# Patient Record
Sex: Male | Born: 1940 | Race: White | Hispanic: No | Marital: Single | Smoking: Current every day smoker
Health system: Southern US, Community
[De-identification: ages and names within clinical notes are randomized; demographics above are authoritative.]

## PROBLEM LIST (undated history)

## (undated) DIAGNOSIS — I825Z9 Chronic embolism and thrombosis of unspecified deep veins of unspecified distal lower extremity: Secondary | ICD-10-CM

## (undated) DIAGNOSIS — F1010 Alcohol abuse, uncomplicated: Secondary | ICD-10-CM

## (undated) DIAGNOSIS — E876 Hypokalemia: Secondary | ICD-10-CM

## (undated) DIAGNOSIS — R310 Gross hematuria: Secondary | ICD-10-CM

## (undated) DIAGNOSIS — M109 Gout, unspecified: Secondary | ICD-10-CM

## (undated) DIAGNOSIS — I10 Essential (primary) hypertension: Secondary | ICD-10-CM

## (undated) DIAGNOSIS — N289 Disorder of kidney and ureter, unspecified: Secondary | ICD-10-CM

## (undated) DIAGNOSIS — D649 Anemia, unspecified: Secondary | ICD-10-CM

## (undated) DIAGNOSIS — N400 Enlarged prostate without lower urinary tract symptoms: Secondary | ICD-10-CM

## (undated) DIAGNOSIS — T148XXA Other injury of unspecified body region, initial encounter: Secondary | ICD-10-CM

## (undated) DIAGNOSIS — K8020 Calculus of gallbladder without cholecystitis without obstruction: Secondary | ICD-10-CM

## (undated) DIAGNOSIS — M1990 Unspecified osteoarthritis, unspecified site: Secondary | ICD-10-CM

## (undated) DIAGNOSIS — D62 Acute posthemorrhagic anemia: Secondary | ICD-10-CM

## (undated) DIAGNOSIS — R531 Weakness: Secondary | ICD-10-CM

## (undated) DIAGNOSIS — R609 Edema, unspecified: Secondary | ICD-10-CM

## (undated) DIAGNOSIS — R339 Retention of urine, unspecified: Secondary | ICD-10-CM

## (undated) HISTORY — PX: KNEE SURGERY: SHX244

## (undated) HISTORY — PX: PROSTATE BIOPSY: SHX241

---

## 1998-12-16 ENCOUNTER — Encounter: Payer: Self-pay | Admitting: Emergency Medicine

## 1998-12-16 ENCOUNTER — Emergency Department (HOSPITAL_COMMUNITY): Admission: EM | Admit: 1998-12-16 | Discharge: 1998-12-16 | Payer: Self-pay | Admitting: *Deleted

## 2000-04-08 ENCOUNTER — Inpatient Hospital Stay (HOSPITAL_COMMUNITY): Admission: EM | Admit: 2000-04-08 | Discharge: 2000-04-10 | Payer: Self-pay | Admitting: Emergency Medicine

## 2000-04-09 ENCOUNTER — Encounter: Payer: Self-pay | Admitting: *Deleted

## 2000-12-09 ENCOUNTER — Emergency Department (HOSPITAL_COMMUNITY): Admission: EM | Admit: 2000-12-09 | Discharge: 2000-12-09 | Payer: Self-pay | Admitting: Emergency Medicine

## 2001-02-14 ENCOUNTER — Emergency Department (HOSPITAL_COMMUNITY): Admission: EM | Admit: 2001-02-14 | Discharge: 2001-02-14 | Payer: Self-pay | Admitting: Emergency Medicine

## 2001-04-09 ENCOUNTER — Emergency Department (HOSPITAL_COMMUNITY): Admission: EM | Admit: 2001-04-09 | Discharge: 2001-04-09 | Payer: Self-pay | Admitting: Emergency Medicine

## 2003-11-06 ENCOUNTER — Emergency Department (HOSPITAL_COMMUNITY): Admission: AC | Admit: 2003-11-06 | Discharge: 2003-11-06 | Payer: Self-pay

## 2003-11-19 ENCOUNTER — Emergency Department (HOSPITAL_COMMUNITY): Admission: EM | Admit: 2003-11-19 | Discharge: 2003-11-20 | Payer: Self-pay

## 2003-12-28 ENCOUNTER — Emergency Department (HOSPITAL_COMMUNITY): Admission: EM | Admit: 2003-12-28 | Discharge: 2003-12-29 | Payer: Self-pay | Admitting: Emergency Medicine

## 2004-12-18 ENCOUNTER — Emergency Department (HOSPITAL_COMMUNITY): Admission: EM | Admit: 2004-12-18 | Discharge: 2004-12-18 | Payer: Self-pay | Admitting: Emergency Medicine

## 2005-05-02 ENCOUNTER — Emergency Department (HOSPITAL_COMMUNITY): Admission: EM | Admit: 2005-05-02 | Discharge: 2005-05-02 | Payer: Self-pay | Admitting: Emergency Medicine

## 2006-01-24 ENCOUNTER — Emergency Department (HOSPITAL_COMMUNITY): Admission: EM | Admit: 2006-01-24 | Discharge: 2006-01-24 | Payer: Self-pay | Admitting: Emergency Medicine

## 2007-11-03 ENCOUNTER — Ambulatory Visit (HOSPITAL_COMMUNITY): Admission: RE | Admit: 2007-11-03 | Discharge: 2007-11-03 | Payer: Self-pay | Admitting: Family Medicine

## 2011-09-11 ENCOUNTER — Encounter (HOSPITAL_COMMUNITY): Payer: Self-pay

## 2011-09-11 ENCOUNTER — Emergency Department (HOSPITAL_COMMUNITY): Payer: Medicare Other

## 2011-09-11 ENCOUNTER — Inpatient Hospital Stay (HOSPITAL_COMMUNITY)
Admission: EM | Admit: 2011-09-11 | Discharge: 2011-09-14 | DRG: 562 | Disposition: A | Discharge status: Skilled Nursing Facility | Payer: Medicare Other | Attending: Internal Medicine | Admitting: Internal Medicine

## 2011-09-11 ENCOUNTER — Other Ambulatory Visit: Payer: Self-pay

## 2011-09-11 DIAGNOSIS — E871 Hypo-osmolality and hyponatremia: Secondary | ICD-10-CM

## 2011-09-11 DIAGNOSIS — D72819 Decreased white blood cell count, unspecified: Secondary | ICD-10-CM | POA: Diagnosis present

## 2011-09-11 DIAGNOSIS — W19XXXA Unspecified fall, initial encounter: Secondary | ICD-10-CM | POA: Diagnosis present

## 2011-09-11 DIAGNOSIS — E872 Acidosis, unspecified: Secondary | ICD-10-CM | POA: Diagnosis present

## 2011-09-11 DIAGNOSIS — D61818 Other pancytopenia: Secondary | ICD-10-CM | POA: Diagnosis present

## 2011-09-11 DIAGNOSIS — L03314 Cellulitis of groin: Secondary | ICD-10-CM | POA: Diagnosis present

## 2011-09-11 DIAGNOSIS — R531 Weakness: Secondary | ICD-10-CM | POA: Diagnosis present

## 2011-09-11 DIAGNOSIS — D638 Anemia in other chronic diseases classified elsewhere: Secondary | ICD-10-CM | POA: Diagnosis present

## 2011-09-11 DIAGNOSIS — M129 Arthropathy, unspecified: Secondary | ICD-10-CM | POA: Diagnosis present

## 2011-09-11 DIAGNOSIS — E43 Unspecified severe protein-calorie malnutrition: Secondary | ICD-10-CM | POA: Diagnosis present

## 2011-09-11 DIAGNOSIS — L02219 Cutaneous abscess of trunk, unspecified: Secondary | ICD-10-CM | POA: Diagnosis present

## 2011-09-11 DIAGNOSIS — L03319 Cellulitis of trunk, unspecified: Secondary | ICD-10-CM | POA: Diagnosis present

## 2011-09-11 DIAGNOSIS — F1010 Alcohol abuse, uncomplicated: Secondary | ICD-10-CM | POA: Diagnosis present

## 2011-09-11 DIAGNOSIS — R6251 Failure to thrive (child): Secondary | ICD-10-CM

## 2011-09-11 DIAGNOSIS — R5381 Other malaise: Secondary | ICD-10-CM | POA: Diagnosis present

## 2011-09-11 DIAGNOSIS — R627 Adult failure to thrive: Secondary | ICD-10-CM | POA: Diagnosis present

## 2011-09-11 DIAGNOSIS — S42309A Unspecified fracture of shaft of humerus, unspecified arm, initial encounter for closed fracture: Secondary | ICD-10-CM

## 2011-09-11 DIAGNOSIS — S42209A Unspecified fracture of upper end of unspecified humerus, initial encounter for closed fracture: Principal | ICD-10-CM | POA: Diagnosis present

## 2011-09-11 DIAGNOSIS — Z888 Allergy status to other drugs, medicaments and biological substances status: Secondary | ICD-10-CM

## 2011-09-11 DIAGNOSIS — S42302A Unspecified fracture of shaft of humerus, left arm, initial encounter for closed fracture: Secondary | ICD-10-CM

## 2011-09-11 DIAGNOSIS — Z7982 Long term (current) use of aspirin: Secondary | ICD-10-CM

## 2011-09-11 DIAGNOSIS — D649 Anemia, unspecified: Secondary | ICD-10-CM | POA: Diagnosis present

## 2011-09-11 DIAGNOSIS — I10 Essential (primary) hypertension: Secondary | ICD-10-CM | POA: Diagnosis present

## 2011-09-11 DIAGNOSIS — M109 Gout, unspecified: Secondary | ICD-10-CM | POA: Diagnosis present

## 2011-09-11 DIAGNOSIS — E860 Dehydration: Secondary | ICD-10-CM | POA: Diagnosis present

## 2011-09-11 DIAGNOSIS — E876 Hypokalemia: Secondary | ICD-10-CM | POA: Diagnosis present

## 2011-09-11 DIAGNOSIS — B356 Tinea cruris: Secondary | ICD-10-CM | POA: Diagnosis present

## 2011-09-11 HISTORY — DX: Essential (primary) hypertension: I10

## 2011-09-11 HISTORY — DX: Unspecified osteoarthritis, unspecified site: M19.90

## 2011-09-11 LAB — DIFFERENTIAL
Basophils Relative: 1 % (ref 0–1)
Eosinophils Absolute: 0 10*3/uL (ref 0.0–0.7)
Lymphocytes Relative: 16 % (ref 12–46)
Lymphs Abs: 0.9 10*3/uL (ref 0.7–4.0)
Monocytes Relative: 5 % (ref 3–12)
Neutro Abs: 4.5 10*3/uL (ref 1.7–7.7)

## 2011-09-11 LAB — URINE MICROSCOPIC-ADD ON

## 2011-09-11 LAB — URINALYSIS, ROUTINE W REFLEX MICROSCOPIC
Ketones, ur: 15 mg/dL — AB
Protein, ur: 30 mg/dL — AB
Urobilinogen, UA: 4 mg/dL — ABNORMAL HIGH (ref 0.0–1.0)
pH: 5.5 (ref 5.0–8.0)

## 2011-09-11 LAB — LIPASE, BLOOD: Lipase: 22 U/L (ref 11–59)

## 2011-09-11 LAB — CBC
HCT: 30.5 % — ABNORMAL LOW (ref 39.0–52.0)
MCH: 30.7 pg (ref 26.0–34.0)
MCHC: 32.5 g/dL (ref 30.0–36.0)
MCV: 94.7 fL (ref 78.0–100.0)
RBC: 3.22 MIL/uL — ABNORMAL LOW (ref 4.22–5.81)
RDW: 17.4 % — ABNORMAL HIGH (ref 11.5–15.5)
WBC: 5.8 10*3/uL (ref 4.0–10.5)

## 2011-09-11 LAB — CK TOTAL AND CKMB (NOT AT ARMC): Total CK: 33 U/L (ref 7–232)

## 2011-09-11 LAB — RAPID URINE DRUG SCREEN, HOSP PERFORMED
Amphetamines: NOT DETECTED
Benzodiazepines: NOT DETECTED
Opiates: NOT DETECTED
Tetrahydrocannabinol: NOT DETECTED

## 2011-09-11 LAB — MAGNESIUM: Magnesium: 1.7 mg/dL (ref 1.5–2.5)

## 2011-09-11 LAB — BASIC METABOLIC PANEL
BUN: 17 mg/dL (ref 6–23)
CO2: 15 mEq/L — ABNORMAL LOW (ref 19–32)
Chloride: 95 mEq/L — ABNORMAL LOW (ref 96–112)
GFR calc Af Amer: >90 mL/min (ref >90)
Potassium: 3.3 mEq/L — ABNORMAL LOW (ref 3.5–5.1)

## 2011-09-11 LAB — TROPONIN I: Troponin I: <0.3 ng/mL (ref <0.30)

## 2011-09-11 LAB — OCCULT BLOOD, POC DEVICE: Fecal Occult Bld: NEGATIVE

## 2011-09-11 MED ORDER — ACETAMINOPHEN 650 MG RE SUPP: 650.0000 mg | Freq: Four times a day (QID) | RECTAL | Status: DC | PRN

## 2011-09-11 MED ORDER — SULFAMETHOXAZOLE-TMP DS 800-160 MG PO TABS
1.0000 | ORAL_TABLET | Freq: Two times a day (BID) | ORAL | Status: DC
  Administered 2011-09-11 – 2011-09-14 (×6): 1 via ORAL
  Filled 2011-09-11 (×7): qty 1

## 2011-09-11 MED ORDER — FOLIC ACID 1 MG PO TABS
1.0000 mg | ORAL_TABLET | Freq: Every day | ORAL | Status: DC
  Administered 2011-09-11 – 2011-09-14 (×4): 1 mg via ORAL
  Filled 2011-09-11 (×4): qty 1

## 2011-09-11 MED ORDER — THIAMINE HCL 100 MG/ML IJ SOLN: 100.0000 mg | Freq: Every day | INTRAMUSCULAR | Status: DC

## 2011-09-11 MED ORDER — ONDANSETRON HCL 4 MG PO TABS: 4.0000 mg | ORAL_TABLET | Freq: Four times a day (QID) | ORAL | Status: DC | PRN

## 2011-09-11 MED ORDER — NYSTATIN 100000 UNIT/GM EX CREA
TOPICAL_CREAM | Freq: Two times a day (BID) | CUTANEOUS | Status: DC
  Administered 2011-09-11 – 2011-09-13 (×5): via TOPICAL
  Filled 2011-09-11: qty 15

## 2011-09-11 MED ORDER — LORAZEPAM 1 MG PO TABS: 0.0000 mg | ORAL_TABLET | Freq: Two times a day (BID) | ORAL | Status: DC

## 2011-09-11 MED ORDER — LORAZEPAM 2 MG/ML IJ SOLN: 1.0000 mg | Freq: Four times a day (QID) | INTRAMUSCULAR | Status: DC | PRN

## 2011-09-11 MED ORDER — ADULT MULTIVITAMIN W/MINERALS CH
1.0000 | ORAL_TABLET | Freq: Every day | ORAL | Status: DC
  Administered 2011-09-11 – 2011-09-14 (×4): 1 via ORAL
  Filled 2011-09-11 (×4): qty 1

## 2011-09-11 MED ORDER — POTASSIUM CHLORIDE 20 MEQ PO PACK
20.0000 meq | PACK | Freq: Once | ORAL | Status: AC
  Administered 2011-09-11: 20 meq via ORAL
  Filled 2011-09-11: qty 1

## 2011-09-11 MED ORDER — MORPHINE SULFATE 2 MG/ML IJ SOLN
2.0000 mg | Freq: Once | INTRAMUSCULAR | Status: AC
  Administered 2011-09-11: 2 mg via INTRAVENOUS
  Filled 2011-09-11: qty 1

## 2011-09-11 MED ORDER — SODIUM CHLORIDE 0.9 % IV BOLUS (SEPSIS)
500.0000 mL | Freq: Once | INTRAVENOUS | Status: AC
  Administered 2011-09-11: 500 mL via INTRAVENOUS

## 2011-09-11 MED ORDER — THIAMINE HCL 100 MG/ML IJ SOLN
Freq: Once | INTRAVENOUS | Status: AC
  Administered 2011-09-11: 18:00:00 via INTRAVENOUS
  Filled 2011-09-11: qty 1000

## 2011-09-11 MED ORDER — LORAZEPAM 1 MG PO TABS
1.0000 mg | ORAL_TABLET | Freq: Four times a day (QID) | ORAL | Status: DC | PRN
  Administered 2011-09-12: 1 mg via ORAL

## 2011-09-11 MED ORDER — ENOXAPARIN SODIUM 40 MG/0.4ML ~~LOC~~ SOLN
40.0000 mg | Freq: Every day | SUBCUTANEOUS | Status: DC
  Administered 2011-09-11 – 2011-09-14 (×3): 40 mg via SUBCUTANEOUS
  Filled 2011-09-11 (×3): qty 0.4

## 2011-09-11 MED ORDER — ONDANSETRON HCL 4 MG/2ML IJ SOLN: 4.0000 mg | Freq: Four times a day (QID) | INTRAMUSCULAR | Status: DC | PRN

## 2011-09-11 MED ORDER — ACETAMINOPHEN 325 MG PO TABS: 650.0000 mg | ORAL_TABLET | Freq: Four times a day (QID) | ORAL | Status: DC | PRN

## 2011-09-11 MED ORDER — HYDROCODONE-ACETAMINOPHEN 5-325 MG PO TABS
1.0000 | ORAL_TABLET | ORAL | Status: DC | PRN
  Administered 2011-09-12: 2 via ORAL
  Administered 2011-09-12: 1 via ORAL
  Administered 2011-09-13 – 2011-09-14 (×3): 2 via ORAL
  Filled 2011-09-11 (×4): qty 2
  Filled 2011-09-11: qty 1

## 2011-09-11 MED ORDER — LORAZEPAM 1 MG PO TABS
0.0000 mg | ORAL_TABLET | Freq: Four times a day (QID) | ORAL | Status: AC
  Administered 2011-09-11: 1 mg via ORAL
  Filled 2011-09-11 (×2): qty 1

## 2011-09-11 MED ORDER — VITAMIN B-1 100 MG PO TABS
100.0000 mg | ORAL_TABLET | Freq: Every day | ORAL | Status: DC
  Administered 2011-09-11 – 2011-09-14 (×4): 100 mg via ORAL
  Filled 2011-09-11 (×4): qty 1

## 2011-09-11 MED ORDER — LORAZEPAM 2 MG/ML IJ SOLN
1.0000 mg | Freq: Once | INTRAMUSCULAR | Status: AC
Start: 2011-09-11 — End: 2011-09-11
  Administered 2011-09-11: 1 mg via INTRAVENOUS
  Filled 2011-09-11: qty 1

## 2011-09-11 NOTE — H&P (Signed)
PCP:   Dwana Melena, MD, MD   Chief Complaint:  Pain in left arm and generalized weakness  HPI: This is a 71 year old gentleman who presents to the emergency room today with complaints of generalized weakness and left arm pain. Patient is currently living in a motel for the past 3 years. He also has a history of alcohol abuse. He reports that approximately 4-6 weeks ago he had a fall. He does not remember the circumstances of his fall. Since then he has pain in his left arm. He did not seek any medical attention for it. He also reports that he feels generally weak. His oral intake is very poor for the past few weeks. He continues to drink alcohol. He denies any dizziness, chest pain, fever, cough, palpitations, dysuria, diarrhea, vomiting. He also complains of pain in his feet bilaterally but this has been occurring prior to his fall. He also reports significant pain and itching in his groin area.  He was evaluated in the emergency room he was found to be tachycardic with a heart rate in the 120s and dehydrated. X-rays of his left humerus revealed a fracture. Patient received some IV fluids with improvement in his heart rate. He was noted to have some significant metabolic acidosis with bicarbonate of 15. He is referred for admission.  Allergies:   Allergies  Allergen Reactions  . Lisinopril Other (See Comments)    Dizziness      Past Medical History  Diagnosis Date  . Arthritis   . Gout   . Hypertension     History reviewed. No pertinent past surgical history.  Prior to Admission medications   Not on File    Social History:  reports that he has been smoking.  He does not have any smokeless tobacco history on file. He reports that he drinks alcohol. He reports that he does not use illicit drugs.  No family history on file.  Review of Systems: Positives in bold Constitutional: Denies fever, chills, diaphoresis, appetite change and fatigue.  HEENT: Denies photophobia, eye pain,  redness, hearing loss, ear pain, congestion, sore throat, rhinorrhea, sneezing, mouth sores, trouble swallowing, neck pain, neck stiffness and tinnitus.   Respiratory: Denies SOB, DOE, cough, chest tightness,  and wheezing.   Cardiovascular: Denies chest pain, palpitations and leg swelling.  Gastrointestinal: Denies nausea, vomiting, abdominal pain, diarrhea, constipation, blood in stool and abdominal distention.  Genitourinary: Denies dysuria, urgency, frequency, hematuria, flank pain and difficulty urinating.  Musculoskeletal: Denies myalgias, back pain, joint swelling, arthralgias and gait problem.  Skin: Denies pallor, rash and wound.  Neurological: Denies dizziness, seizures, syncope, weakness, light-headedness, numbness and headaches.  Hematological: Denies adenopathy. Easy bruising, personal or family bleeding history  Psychiatric/Behavioral: Denies suicidal ideation, mood changes, confusion, nervousness, sleep disturbance and agitation   Physical Exam: Blood pressure 142/66, pulse 97, temperature 99.2 F (37.3 C), temperature source Rectal, height 5\' 11"  (1.803 m), weight 83.915 kg (185 lb), SpO2 100.00%. Gen. patient is lying in bed, in no acute distress, he is emaciated, very unkempt, disheveled HEENT: Normocephalic, atraumatic, pupils are equal round reactive to light, mucous membranes are tried Chest: Clear to auscultation bilaterally Cardiac: S1, S2, regular rate and rhythm Abdomen: Soft, nontender, bowel sounds are active Extremities: No cyanosis, clubbing, edema, Neurologic: Grossly intact, nonfocal, he does have some mild tremors Skin patient's skin in the groin is very erythematous, there is a foul-smelling drainage coming from the left groin, bullous lesions are in the left groin, no signs of bleeding.  Labs  on Admission:  Results for orders placed during the hospital encounter of 09/11/11 (from the past 48 hour(s))  CBC     Status: Abnormal   Collection Time   09/11/11  12:54 PM      Component Value Range Comment   WBC 5.8  4.0 - 10.5 (K/uL)    RBC 3.22 (*) 4.22 - 5.81 (MIL/uL)    Hemoglobin 9.9 (*) 13.0 - 17.0 (g/dL)    HCT 19.1 (*) 47.8 - 52.0 (%)    MCV 94.7  78.0 - 100.0 (fL)    MCH 30.7  26.0 - 34.0 (pg)    MCHC 32.5  30.0 - 36.0 (g/dL)    RDW 29.5 (*) 62.1 - 15.5 (%)    Platelets 294  150 - 400 (K/uL)   DIFFERENTIAL     Status: Abnormal   Collection Time   09/11/11 12:54 PM      Component Value Range Comment   Neutrophils Relative 78 (*) 43 - 77 (%)    Neutro Abs 4.5  1.7 - 7.7 (K/uL)    Lymphocytes Relative 16  12 - 46 (%)    Lymphs Abs 0.9  0.7 - 4.0 (K/uL)    Monocytes Relative 5  3 - 12 (%)    Monocytes Absolute 0.3  0.1 - 1.0 (K/uL)    Eosinophils Relative 0  0 - 5 (%)    Eosinophils Absolute 0.0  0.0 - 0.7 (K/uL)    Basophils Relative 1  0 - 1 (%)    Basophils Absolute 0.0  0.0 - 0.1 (K/uL)   BASIC METABOLIC PANEL     Status: Abnormal   Collection Time   09/11/11 12:54 PM      Component Value Range Comment   Sodium 132 (*) 135 - 145 (mEq/L)    Potassium 3.3 (*) 3.5 - 5.1 (mEq/L)    Chloride 95 (*) 96 - 112 (mEq/L)    CO2 15 (*) 19 - 32 (mEq/L)    Glucose, Bld 122 (*) 70 - 99 (mg/dL)    BUN 17  6 - 23 (mg/dL)    Creatinine, Ser 3.08  0.50 - 1.35 (mg/dL)    Calcium 9.6  8.4 - 10.5 (mg/dL)    GFR calc non Af Amer 84 (*) >90 (mL/min)    GFR calc Af Amer >90  >90 (mL/min)   TROPONIN I     Status: Normal   Collection Time   09/11/11 12:54 PM      Component Value Range Comment   Troponin I <0.30  <0.30 (ng/mL)   CK TOTAL AND CKMB     Status: Normal   Collection Time   09/11/11 12:54 PM      Component Value Range Comment   Total CK 33  7 - 232 (U/L)    CK, MB 2.3  0.3 - 4.0 (ng/mL)    Relative Index RELATIVE INDEX IS INVALID  0.0 - 2.5    ETHANOL     Status: Normal   Collection Time   09/11/11 12:54 PM      Component Value Range Comment   Alcohol, Ethyl (B) <11  0 - 11 (mg/dL)   LIPASE, BLOOD     Status: Normal   Collection Time    09/11/11 12:54 PM      Component Value Range Comment   Lipase 22  11 - 59 (U/L)   URINALYSIS, ROUTINE W REFLEX MICROSCOPIC     Status: Abnormal   Collection Time   09/11/11  2:20 PM      Component Value Range Comment   Color, Urine AMBER (*) YELLOW  BIOCHEMICALS MAY BE AFFECTED BY COLOR   APPearance CLEAR  CLEAR     Specific Gravity, Urine >1.030 (*) 1.005 - 1.030     pH 5.5  5.0 - 8.0     Glucose, UA 100 (*) NEGATIVE (mg/dL)    Hgb urine dipstick SMALL (*) NEGATIVE     Bilirubin Urine MODERATE (*) NEGATIVE     Ketones, ur 15 (*) NEGATIVE (mg/dL)    Protein, ur 30 (*) NEGATIVE (mg/dL)    Urobilinogen, UA 4.0 (*) 0.0 - 1.0 (mg/dL)    Nitrite NEGATIVE  NEGATIVE     Leukocytes, UA SMALL (*) NEGATIVE    URINE RAPID DRUG SCREEN (HOSP PERFORMED)     Status: Normal   Collection Time   09/11/11  2:20 PM      Component Value Range Comment   Opiates NONE DETECTED  NONE DETECTED     Cocaine NONE DETECTED  NONE DETECTED     Benzodiazepines NONE DETECTED  NONE DETECTED     Amphetamines NONE DETECTED  NONE DETECTED     Tetrahydrocannabinol NONE DETECTED  NONE DETECTED     Barbiturates NONE DETECTED  NONE DETECTED    URINE MICROSCOPIC-ADD ON     Status: Abnormal   Collection Time   09/11/11  2:20 PM      Component Value Range Comment   Squamous Epithelial / LPF FEW (*) RARE     WBC, UA 3-6  <3 (WBC/hpf)    RBC / HPF 3-6  <3 (RBC/hpf)    Bacteria, UA FEW (*) RARE     Casts HYALINE CASTS (*) NEGATIVE    OCCULT BLOOD, POC DEVICE     Status: Normal   Collection Time   09/11/11  2:28 PM      Component Value Range Comment   Fecal Occult Bld NEGATIVE       Radiological Exams on Admission: Dg Chest 1 View  09/11/2011  *RADIOLOGY REPORT*  Clinical Data: Left shoulder pain, fell 1 month ago  CHEST - 1 VIEW  Comparison: 11/06/2003  Findings: Normal heart size, mediastinal contours, and pulmonary vascularity. Atherosclerotic calcification aorta. Lungs appear emphysematous but clear. No pleural effusion or  pneumothorax. Fracture posterior left seventh rib, suspect old. No acute osseous abnormalities.  IMPRESSION: Emphysematous changes. No acute abnormalities.  Original Report Authenticated By: Lollie Marrow, M.D.   Dg Shoulder Left  09/11/2011  *RADIOLOGY REPORT*  Clinical Data: Larey Seat 1 month ago, pain  LEFT SHOULDER - 2+ VIEW  Comparison: None.  Findings: There is a comminuted fracture of the humeral head which extends to involve the surgical neck.   Slight medial angulation is present.  No glenohumeral dislocation is present. Acromioclavicular joint is intact.  No visible rib or scapular fractures.  IMPRESSION: Comminuted fracture of the humeral head associated with slightly angulated surgical neck fracture.  Original Report Authenticated By: Elsie Stain, M.D.    Assessment/Plan Active Problems:  Alcohol abuse, patient was counseled on the importance of abstaining from alcohol. He will be placed on CIWA protocol as he does seem high risk to go into alcohol withdrawal. He will also receive thiamine and folate.   Anemia, likely secondary to chronic disease and alcohol abuse. His stool Hemoccult is negative. He will likely need a GI evaluation to be done as an outpatient. We will check an anemia panel.   Hypokalemia, this will be  replaced, we'll also check magnesium.   Fracture of left humerus, the patient's arm is in place and a sliding. We will ask orthopedics opinion to see if there is any further management needed at this point. His fracture is likely chronic.   Metabolic acidosis, likely secondary to starvation ketoacidosis. We will provide him with fluids and monitor his chemistry.   Weakness generalized, multifactorial, we'll ask physical therapy to see the patient. On discharge his wishes are to return home. He is not interested in any sort of placement.   Failure to thrive   Cellulitis of groin, patient likely has a fungal infection in his groin with superimposed bacterial infection. We  will place nystatin cream and start a short course of oral antibiotics. He does not have any fever or elevated WBC count.   Time Spent on Admission:  MEMON,JEHANZEB [facility] Pager: [phone] 09/11/2011, 4:26 PM

## 2011-09-11 NOTE — ED Notes (Signed)
Pt fell on left shoulder  1 1/2 months ago.  C/O pain to left shoulder.  Reports has had decreased appetite this past week and hasn't eaten.  Says can't walk due to weakness in his legs.

## 2011-09-11 NOTE — ED Provider Notes (Addendum)
History     CSN: [id number]  Arrival date & time 09/11/11  1236   First MD Initiated Contact with Patient 09/11/11 1259      Chief Complaint  Patient presents with  . Weakness    (Consider location/radiation/quality/duration/timing/severity/associated sxs/prior treatment) HPI Patient states he has pain in left shoulder since fall a 1 1/2 months ago.  Patient states he has pain in his feet.  Patient states he has not eaten in 7 days due to lack of appetite.  Denies vomiting and diarrhea.  Patient states last etoh this a.m.  Patient states drinks 3-4 shots a day.  Denies etoh withdrawal in past.  Patient sees Ayesha Mohair for pmd.  Past Medical History  Diagnosis Date  . Arthritis   . Gout   . Hypertension     History reviewed. No pertinent past surgical history.  No family history on file.  History  Substance Use Topics  . Smoking status: Current Everyday Smoker  . Smokeless tobacco: Not on file  . Alcohol Use: Yes     1/5 liqour/week      Review of Systems  Constitutional: Positive for appetite change, fatigue and unexpected weight change.  Cardiovascular: Negative for chest pain.  Gastrointestinal: Negative for nausea, abdominal pain, diarrhea, constipation, blood in stool and abdominal distention.  Musculoskeletal: Positive for arthralgias.  All other systems reviewed and are negative.    Allergies  Lisinopril  Home Medications  No current outpatient prescriptions on file.  BP 183/86  Pulse 104  Temp(Src) 97.3 F (36.3 C) (Oral)  Ht 5\' 11"  (1.803 m)  Wt 185 lb (83.915 kg)  BMI 25.80 kg/m2  SpO2 100%  Physical Exam  Nursing note and vitals reviewed. Constitutional: He is oriented to person, place, and time. He appears well-developed.       Unkempt male, grey sking,  HENT:  Head: Normocephalic.  Mouth/Throat: Mucous membranes are dry.  Cardiovascular: Tachycardia present.   Pulmonary/Chest: Effort normal.  Abdominal: Soft.  Musculoskeletal:   Left shoulder contusion, tenderness  Neurological: He is alert and oriented to person, place, and time.  Skin: There is pallor.    ED Course  Procedures (including critical care time)   Labs Reviewed  CBC  DIFFERENTIAL  BASIC METABOLIC PANEL  URINALYSIS, ROUTINE W REFLEX MICROSCOPIC   No results found.   No diagnosis found.    Date: 09/11/2011  Rate: 114  Rhythm: sinus tachycardia  QRS Axis: right  Intervals: normal  ST/T Wave abnormalities: nonspecific ST/T changes  Conduction Disutrbances:none  Narrative Interpretation:   Old EKG Reviewed: none available   MDM   Results for orders placed during the hospital encounter of 09/11/11  CBC      Component Value Range   WBC 5.8  4.0 - 10.5 (K/uL)   RBC 3.22 (*) 4.22 - 5.81 (MIL/uL)   Hemoglobin 9.9 (*) 13.0 - 17.0 (g/dL)   HCT 40.9 (*) 81.1 - 52.0 (%)   MCV 94.7  78.0 - 100.0 (fL)   MCH 30.7  26.0 - 34.0 (pg)   MCHC 32.5  30.0 - 36.0 (g/dL)   RDW 91.4 (*) 78.2 - 15.5 (%)   Platelets 294  150 - 400 (K/uL)  DIFFERENTIAL      Component Value Range   Neutrophils Relative 78 (*) 43 - 77 (%)   Neutro Abs 4.5  1.7 - 7.7 (K/uL)   Lymphocytes Relative 16  12 - 46 (%)   Lymphs Abs 0.9  0.7 - 4.0 (K/uL)  Monocytes Relative 5  3 - 12 (%)   Monocytes Absolute 0.3  0.1 - 1.0 (K/uL)   Eosinophils Relative 0  0 - 5 (%)   Eosinophils Absolute 0.0  0.0 - 0.7 (K/uL)   Basophils Relative 1  0 - 1 (%)   Basophils Absolute 0.0  0.0 - 0.1 (K/uL)  BASIC METABOLIC PANEL      Component Value Range   Sodium 132 (*) 135 - 145 (mEq/L)   Potassium 3.3 (*) 3.5 - 5.1 (mEq/L)   Chloride 95 (*) 96 - 112 (mEq/L)   CO2 15 (*) 19 - 32 (mEq/L)   Glucose, Bld 122 (*) 70 - 99 (mg/dL)   BUN 17  6 - 23 (mg/dL)   Creatinine, Ser 1.61  0.50 - 1.35 (mg/dL)   Calcium 9.6  8.4 - 09.6 (mg/dL)   GFR calc non Af Amer 84 (*) >90 (mL/min)   GFR calc Af Amer >90  >90 (mL/min)  URINALYSIS, ROUTINE W REFLEX MICROSCOPIC      Component Value Range   Color,  Urine AMBER (*) YELLOW    APPearance CLEAR  CLEAR    Specific Gravity, Urine >1.030 (*) 1.005 - 1.030    pH 5.5  5.0 - 8.0    Glucose, UA 100 (*) NEGATIVE (mg/dL)   Hgb urine dipstick SMALL (*) NEGATIVE    Bilirubin Urine MODERATE (*) NEGATIVE    Ketones, ur 15 (*) NEGATIVE (mg/dL)   Protein, ur 30 (*) NEGATIVE (mg/dL)   Urobilinogen, UA 4.0 (*) 0.0 - 1.0 (mg/dL)   Nitrite NEGATIVE  NEGATIVE    Leukocytes, UA SMALL (*) NEGATIVE   TROPONIN I      Component Value Range   Troponin I <0.30  <0.30 (ng/mL)  CK TOTAL AND CKMB      Component Value Range   Total CK 33  7 - 232 (U/L)   CK, MB 2.3  0.3 - 4.0 (ng/mL)   Relative Index RELATIVE INDEX IS INVALID  0.0 - 2.5   ETHANOL      Component Value Range   Alcohol, Ethyl (B) <11  0 - 11 (mg/dL)  URINE RAPID DRUG SCREEN (HOSP PERFORMED)      Component Value Range   Opiates NONE DETECTED  NONE DETECTED    Cocaine NONE DETECTED  NONE DETECTED    Benzodiazepines NONE DETECTED  NONE DETECTED    Amphetamines NONE DETECTED  NONE DETECTED    Tetrahydrocannabinol NONE DETECTED  NONE DETECTED    Barbiturates NONE DETECTED  NONE DETECTED   LIPASE, BLOOD      Component Value Range   Lipase 22  11 - 59 (U/L)  OCCULT BLOOD, POC DEVICE      Component Value Range   Fecal Occult Bld NEGATIVE    URINE MICROSCOPIC-ADD ON      Component Value Range   Squamous Epithelial / LPF FEW (*) RARE    WBC, UA 3-6  <3 (WBC/hpf)   RBC / HPF 3-6  <3 (RBC/hpf)   Bacteria, UA FEW (*) RARE    Casts HYALINE CASTS (*) NEGATIVE    v  Dg Chest 1 View  09/11/2011  *RADIOLOGY REPORT*  Clinical Data: Left shoulder pain, fell 1 month ago  CHEST - 1 VIEW  Comparison: 11/06/2003  Findings: Normal heart size, mediastinal contours, and pulmonary vascularity. Atherosclerotic calcification aorta. Lungs appear emphysematous but clear. No pleural effusion or pneumothorax. Fracture posterior left seventh rib, suspect old. No acute osseous abnormalities.  IMPRESSION: Emphysematous  changes. No acute abnormalities.  Original Report Authenticated By: Lollie Marrow, M.D.   Dg Shoulder Left  09/11/2011  *RADIOLOGY REPORT*  Clinical Data: Larey Seat 1 month ago, pain  LEFT SHOULDER - 2+ VIEW  Comparison: None.  Findings: There is a comminuted fracture of the humeral head which extends to involve the surgical neck.   Slight medial angulation is present.  No glenohumeral dislocation is present. Acromioclavicular joint is intact.  No visible rib or scapular fractures.  IMPRESSION: Comminuted fracture of the humeral head associated with slightly angulated surgical neck fracture.  Original Report Authenticated By: Elsie Stain, M.D.     Patient with multiple lab abnormalitis including hyponatremia, hypokalemia, and anemia.  Patient initially tachycardiac but hr decreased with 500cc bolus.  Daughter arrived but has not seen father for extended period of time.   Discussed with Dr. Kerry Hough and plan admission to tele bed.  Discussed with patient and daughter.  PO potassium given.  Stool hemocult negative.    1- volume depletion- patient states no po and has electrolyte abnormalities.  Fluid and electrolytes being replenished with caution for volume overload.  2- Anemia- stool negative here.  Further assesment neede.   3- humeral head fracture- patient reports injury from fall one month ago.   Sling placed here and ortho will need to assess.  4- alcohol abuse- patient with last etoh this a.m.  HR decreased after ns bolus but may require alcohol withdrawal protocol.    Hilario Quarry, MD 09/11/11 1519  Hilario Quarry, MD 09/11/11 1524  Hilario Quarry, MD 10/28/11 [phone]

## 2011-09-12 ENCOUNTER — Telehealth: Payer: Self-pay | Admitting: Orthopedic Surgery

## 2011-09-12 DIAGNOSIS — E43 Unspecified severe protein-calorie malnutrition: Secondary | ICD-10-CM | POA: Diagnosis present

## 2011-09-12 DIAGNOSIS — D61818 Other pancytopenia: Secondary | ICD-10-CM | POA: Diagnosis present

## 2011-09-12 LAB — IRON AND TIBC
Iron: 71 ug/dL (ref 42–135)
TIBC: 304 ug/dL (ref 215–435)
UIBC: 233 ug/dL (ref 125–400)

## 2011-09-12 LAB — CBC
MCH: 30.5 pg (ref 26.0–34.0)
MCHC: 32.2 g/dL (ref 30.0–36.0)
Platelets: 140 10*3/uL — ABNORMAL LOW (ref 150–400)
RDW: 17.4 % — ABNORMAL HIGH (ref 11.5–15.5)

## 2011-09-12 LAB — COMPREHENSIVE METABOLIC PANEL
ALT: <5 U/L (ref 0–53)
AST: 17 U/L (ref 0–37)
Calcium: 8.2 mg/dL — ABNORMAL LOW (ref 8.4–10.5)
Sodium: 135 mEq/L (ref 135–145)
Total Protein: 5.6 g/dL — ABNORMAL LOW (ref 6.0–8.3)

## 2011-09-12 LAB — FERRITIN: Ferritin: 308 ng/mL (ref 22–322)

## 2011-09-12 MED ORDER — SODIUM CHLORIDE 0.9 % IV BOLUS (SEPSIS)
500.0000 mL | Freq: Once | INTRAVENOUS | Status: AC
  Administered 2011-09-12: 500 mL via INTRAVENOUS

## 2011-09-12 MED ORDER — BIOTENE DRY MOUTH MT LIQD
15.0000 mL | Freq: Two times a day (BID) | OROMUCOSAL | Status: DC
  Administered 2011-09-12 – 2011-09-14 (×5): 15 mL via OROMUCOSAL

## 2011-09-12 NOTE — Evaluation (Signed)
Physical Therapy Evaluation Patient Details Name: Jacob Riddle MRN: [id number] DOB: [birth year] Today's Date: 09/12/2011 Time: 8:19-8:59  Charges:1 EV Problem List:  Patient Active Problem List  Diagnoses  . Alcohol abuse  . Anemia  . Hypokalemia  . Fracture of left humerus  . Metabolic acidosis  . Weakness generalized  . Failure to thrive  . Cellulitis of groin    Past Medical History:  Past Medical History  Diagnosis Date  . Arthritis   . Gout   . Hypertension    Past Surgical History: History reviewed. No pertinent past surgical history.  PT Assessment/Plan/Recommendation PT Assessment Clinical Impression Statement: 71 y.o. male presents to [facility] for weakness, fall (~1.5 mo ago), FTT, dehydration and ETOH withdrawl.  He is very deconditioned, shaky from withdrawl, weak throughout his arms and legs, and has significant deficits with his mobility, gait and balance.  It will to be determined as to if he can return home as he recovers from withdrawl and eats and rehydrates.  He would benefit from acute PT to follow him to help porogress mobility, balance, gait and assess if he needs an assistive device or f/u PT.   PT Recommendation/Assessment: Patient will need skilled PT in the acute care venue PT Problem List: Decreased strength;Decreased activity tolerance;Decreased balance;Decreased mobility;Decreased coordination;Decreased knowledge of use of DME;Pain PT Therapy Diagnosis : Difficulty walking;Abnormality of gait;Generalized weakness;Acute pain PT Plan PT Frequency: Min 3X/week PT Treatment/Interventions: DME instruction;Gait training;Functional mobility training;Therapeutic activities;Therapeutic exercise;Balance training;Neuromuscular re-education;Patient/family education PT Recommendation Follow Up Recommendations: Home health PT;Skilled nursing facility (depends on progress, would he be behav health candidate?) PT Goals  Acute Rehab PT Goals PT Goal Formulation: With  patient Time For Goal Achievement: 2 weeks Pt will go Supine/Side to Sit: with modified independence PT Goal: Supine/Side to Sit - Progress: Goal set today Pt will go Sit to Supine/Side: with modified independence PT Goal: Sit to Supine/Side - Progress: Goal set today Pt will go Sit to Stand: with modified independence PT Goal: Sit to Stand - Progress: Goal set today Pt will go Stand to Sit: with modified independence PT Goal: Stand to Sit - Progress: Goal set today Pt will Transfer Bed to Chair/Chair to Bed: with modified independence PT Transfer Goal: Bed to Chair/Chair to Bed - Progress: Goal set today Pt will Ambulate: >150 feet;with modified independence;with least restrictive assistive device PT Goal: Ambulate - Progress: Goal set today  PT Evaluation Precautions/Restrictions  Precautions Precautions: Fall Restrictions Other Position/Activity Restrictions: L arm sling Prior Functioning  Home Living Lives With: Alone Type of Home: Apartment ("units" extended stay hotel?) Home Layout: One level Home Access: Level entry Additional Comments: Patient when he was well ~1.5 mo ago was completely independent.  Now he reports "i Can't walk" Prior Function Driving: No Vocation: Unemployed Comments: is a [employer] of [employer] Extremity Assessment LUE Assessment LUE Assessment:  (adjusted L arm sling to fit more securely) RLE Strength RLE Overall Strength: Deficits RLE Overall Strength Comments: grossly 3-/5 EOB per MMT against gravity LLE Strength LLE Overall Strength: Deficits LLE Overall Strength Comments: grossly 3-/5 per MMT EOB against gravity Mobility (including Balance) Bed Mobility Bed Mobility: Yes Supine to Sit: 3: Mod assist;With rails;HOB elevated (Comment degrees) (HOB 55 degrees) Supine to Sit Details (indicate cue type and reason): mod assist to move bil legs to EOB and to supoort trunk to get to upright sitting.  The patient is able to help by pulling with right  arm on rail.   Sitting - Scoot  to Edge of Bed: 3: Mod assist Sitting - Scoot to Edge of Bed Details (indicate cue type and reason): mod assist to help weight shift hips to EOB using draw pad on bed.   Sit to Supine: 3: Mod assist Sit to Supine - Details (indicate cue type and reason): mod assist of legs to get them back into the bed. The patient used gravity to lower trunk to bed with verbal cuse from PT to go down onto right elbow.   Scooting to [facility]: 2: Max assist Scooting to [facility] Details (indicate cue type and reason): max assist with bed in trendelenberg to help patient scoot up in bed.   Transfers Transfers: No (the patient did not feel strong enough to stand) Ambulation/Gait Ambulation/Gait: No (unable at this time)  Static Sitting Balance Static Sitting - Balance Support: No upper extremity supported;Feet supported Static Sitting - Level of Assistance: 5: Stand by assistance Static Sitting - Comment/# of Minutes: 30 mins to eat breakfast, patient visably shaking from withdrawl and the effort of sitting up without trunk support.   EEnd of Session PT - End of Session Activity Tolerance: Patient limited by fatigue Patient left: in bed;with call bell in reach;with bed alarm set Nurse Communication: Mobility status for transfers General Behavior During Session: Lethargic Cognition: [facility] for tasks performed (not specifically tested, but conversation WFL)  Lurena Joiner B. Bow Buntyn, PT, DPT  09/12/2011, 9:16 AM

## 2011-09-12 NOTE — Progress Notes (Signed)
Subjective: Pain in arm is better, pain in groin is better, still feels weak  Objective: Vital signs in last 24 hours: Temp:  [97 F (36.1 C)-98.1 F (36.7 C)] 97.6 F (36.4 C) (02/06 1400) Pulse Rate:  [76-97] 83  (02/06 1400) Resp:  [18-24] 18  (02/06 1400) BP: (121-181)/(64-78) 121/74 mmHg (02/06 1400) SpO2:  [97 %-100 %] 100 % (02/06 1400) Weight:  [73.1 kg (161 lb 2.5 oz)] 73.1 kg (161 lb 2.5 oz) (02/05 1713) Weight change:  Last BM Date:  (pt unsure)  Intake/Output from previous day: 02/05 0701 - 02/06 0700 In: 180 [P.O.:180] Out: 0  Total I/O In: 615 [P.O.:240; Other:375] Out: -    Physical Exam: General: Alert, awake, oriented x3, in no acute distress. HEENT: No bruits, no goiter. Heart: Regular rate and rhythm, without murmurs, rubs, gallops. Lungs: Clear to auscultation bilaterally. Abdomen: Soft, nontender, nondistended, positive bowel sounds. Extremities: No clubbing cyanosis or edema with positive pedal pulses. Neuro: Grossly intact, nonfocal.    Lab Results: Basic Metabolic Panel:  Basename 09/12/11 0534 09/11/11 1254  NA 135 132*  K 3.6 3.3*  CL 106 95*  CO2 19 15*  GLUCOSE 101* 122*  BUN 19 17  CREATININE 0.95 0.93  CALCIUM 8.2* 9.6  MG -- 1.7  PHOS -- --   Liver Function Tests:  [facility] 09/12/11 0534  AST 17  ALT <5  ALKPHOS 63  BILITOT 0.7  PROT 5.6*  ALBUMIN 2.0*    Basename 09/11/11 1254  LIPASE 22  AMYLASE --   No results found for this basename: AMMONIA:2 in the last 72 hours CBC:  Basename 09/12/11 0534 09/11/11 1254  WBC 2.0* 5.8  NEUTROABS -- 4.5  HGB 7.3* 9.9*  HCT 22.7* 30.5*  MCV 95.0 94.7  PLT 140* 294   Cardiac Enzymes:  Basename 09/11/11 1254  CKTOTAL 33  CKMB 2.3  CKMBINDEX --  TROPONINI <0.30   BNP: No results found for this basename: PROBNP:3 in the last 72 hours D-Dimer: No results found for this basename: DDIMER:2 in the last 72 hours CBG: No results found for this basename: GLUCAP:6 in the  last 72 hours Hemoglobin A1C: No results found for this basename: HGBA1C in the last 72 hours Fasting Lipid Panel: No results found for this basename: CHOL,HDL,LDLCALC,TRIG,CHOLHDL,LDLDIRECT in the last 72 hours Thyroid Function Tests: No results found for this basename: TSH,T4TOTAL,FREET4,T3FREE,THYROIDAB in the last 72 hours Anemia Panel:  Basename 09/11/11 1254  VITAMINB12 285  FOLATE 2.7*  FERRITIN 308  TIBC 304  IRON 71  RETICCTPCT 3.6*   Coagulation: No results found for this basename: LABPROT:2,INR:2 in the last 72 hours Urine Drug Screen: Drugs of Abuse     Component Value Date/Time   LABOPIA NONE DETECTED 09/11/2011 1420   COCAINSCRNUR NONE DETECTED 09/11/2011 1420   LABBENZ NONE DETECTED 09/11/2011 1420   AMPHETMU NONE DETECTED 09/11/2011 1420   THCU NONE DETECTED 09/11/2011 1420   LABBARB NONE DETECTED 09/11/2011 1420    Alcohol Level:  Basename 09/11/11 1254  ETH <11   Urinalysis:  Basename 09/11/11 1420  COLORURINE AMBER*  LABSPEC >1.030*  PHURINE 5.5  GLUCOSEU 100*  HGBUR SMALL*  BILIRUBINUR MODERATE*  KETONESUR 15*  PROTEINUR 30*  UROBILINOGEN 4.0*  NITRITE NEGATIVE  LEUKOCYTESUR SMALL*    No results found for this or any previous visit (from the past 240 hour(s)).  Studies/Results: Dg Chest 1 View  09/11/2011  *RADIOLOGY REPORT*  Clinical Data: Left shoulder pain, fell 1 month ago  CHEST -  1 VIEW  Comparison: 11/06/2003  Findings: Normal heart size, mediastinal contours, and pulmonary vascularity. Atherosclerotic calcification aorta. Lungs appear emphysematous but clear. No pleural effusion or pneumothorax. Fracture posterior left seventh rib, suspect old. No acute osseous abnormalities.  IMPRESSION: Emphysematous changes. No acute abnormalities.  Original Report Authenticated By: Lollie Marrow, M.D.   Dg Shoulder Left  09/11/2011  *RADIOLOGY REPORT*  Clinical Data: Larey Seat 1 month ago, pain  LEFT SHOULDER - 2+ VIEW  Comparison: None.  Findings: There is a  comminuted fracture of the humeral head which extends to involve the surgical neck.   Slight medial angulation is present.  No glenohumeral dislocation is present. Acromioclavicular joint is intact.  No visible rib or scapular fractures.  IMPRESSION: Comminuted fracture of the humeral head associated with slightly angulated surgical neck fracture.  Original Report Authenticated By: Elsie Stain, M.D.    Medications: Scheduled Meds:   . antiseptic oral rinse  15 mL Mouth Rinse BID  . general admission iv infusion   Intravenous Once  . enoxaparin  40 mg Subcutaneous Daily  . folic acid  1 mg Oral Daily  . LORazepam  1 mg Intravenous Once  . LORazepam  0-4 mg Oral Q6H   Followed by  . LORazepam  0-4 mg Oral Q12H  . mulitivitamin with minerals  1 tablet Oral Daily  . nystatin cream   Topical BID  . sodium chloride  500 mL Intravenous Once  . sulfamethoxazole-trimethoprim  1 tablet Oral Q12H  . thiamine  100 mg Oral Daily   Or  . thiamine  100 mg Intravenous Daily   Continuous Infusions:  PRN Meds:.acetaminophen, acetaminophen, HYDROcodone-acetaminophen, LORazepam, LORazepam, ondansetron (ZOFRAN) IV, ondansetron  Assessment/Plan: This 71 y/o male who lives in a motel for the past 3 years, presented to the ED for generalized weakness, difficulty ambulating and pain in left arm after a fall many weeks ago.  On arrival to the emergency room, he was found to be significantly anemic, unkempt, had multiple metabolic abnormalities, and had a significant groin rash. He was admitted to the hospital for further evaluation and treatment.    Alcohol abuse, patient was counseled on the importance of abstaining from alcohol. He will be placed on CIWA protocol as he does seem high risk to go into alcohol withdrawal. He will also receive thiamine and folate.   Pancytopenia, likely secondary to chronic, alcohol abuse. His stool Hemoccult is negative. He will likely need a GI evaluation to be done as an  outpatient. Iron stores normal.  Will transfuse 2 units PRBC since he is symptomatic from anemia.  Patient is also folate deficient, likely due to malnutrition, will replace.   Hypokalemia, this will be replaced,   Fracture of left humerus, the patient's arm is in place and a sliding. We will ask orthopedics opinion to see if there is any further management needed at this point. His fracture is likely chronic. Consult has been called, await evaluation  Metabolic acidosis, likely secondary to starvation ketoacidosis. Improving with IV fluids  Weakness generalized, multifactorial, we'll ask physical therapy to see the patient. On discharge his wishes are to return home. He is not interested in any sort of placement.   Failure to thrive, Severe protein calorie malnutrition, nutrition consult   Cellulitis of groin, patient likely has a fungal infection in his groin with superimposed bacterial infection. We will place nystatin cream and start a short course of oral antibiotics. He does not have any fever or elevated WBC  count. Patient does have bullous lesions, will check HSV  Not ready for discharge yet.   LOS: 1 day   Mystie Ormand [facility] Pager: [phone] 09/12/2011, 3:33 PM

## 2011-09-12 NOTE — Progress Notes (Signed)
CSW met with pt following referral from progression.  Full note and SBIRT in shadow chart.  Pt has lived in extended stay motel for the past several years.  He denies any issues with resources or access to food as reported in progression.  He states he has no intention of going to SNF at d/c although he admits he is weak.  He will consider home health.  CM aware. Pt admits to drinking about 2 shots of liquor daily for pain control.  He denies any pain medication or drug use.  He does not feel his alcohol use is a problem and refuses any outpatient referrals.  CSW will sign off unless d/c plan changes or further needs arise.    Jacob Riddle

## 2011-09-12 NOTE — Progress Notes (Signed)
INITIAL ADULT NUTRITION ASSESSMENT Date: 09/12/2011   Time: 12:49 PM Reason for Assessment: Consult  ASSESSMENT: Male 71 y.o.  Dx:  Alcohol abuse, left humerus fracture, metabolic acidosis, FTT  Hx:  Past Medical History  Diagnosis Date  . Arthritis   . Gout   . Hypertension    Related Meds:     . antiseptic oral rinse  15 mL Mouth Rinse BID  . general admission iv infusion   Intravenous Once  . enoxaparin  40 mg Subcutaneous Daily  . folic acid  1 mg Oral Daily  . LORazepam  1 mg Intravenous Once  . LORazepam  0-4 mg Oral Q6H   Followed by  . LORazepam  0-4 mg Oral Q12H  .  morphine injection  2 mg Intravenous Once  . mulitivitamin with minerals  1 tablet Oral Daily  . nystatin cream   Topical BID  . potassium chloride  20 mEq Oral Once  . sodium chloride  500 mL Intravenous Once  . sodium chloride  500 mL Intravenous Once  . sulfamethoxazole-trimethoprim  1 tablet Oral Q12H  . thiamine  100 mg Oral Daily   Or  . thiamine  100 mg Intravenous Daily    Ht: 5\' 11"  (180.3 cm)  Wt: 161 lb 2.5 oz (73.1 kg)  Ideal Wt: 78.18 kg % Ideal Wt: 93.5%  Usual Wt: 195 lb/ 88.6 kg ( 1.5 months prior per pt report) % Usual Wt: 82.5%  Body mass index is 22.48 kg/(m^2). WNL  Food/Nutrition Related Hx:  Pt states he began losing weight 1.5 months ago, this weight loss was unintentional. Pt denies being sick during this time. Pt states he has not had a appetite since then. Pt reports he will eat a pack of cheese crackers and a pepsi about once every week or two. Pt consumes about 2 shots of liquor daily per social work note.  Pt asked where/how he gets his meals; pt said their is no grocery store close by, he gets his meals/snacks from a convenience store 0.25 miles away from the motel he lives at. Pt reports he is unable to walk to the store; he has a friend, an employee at [employer], go to the store for him. The friend only goes to the store when asked by the pt.  Patient eating  at time of visit. Appeared to have difficulty getting food on fork. Patient denied the need for finger foods. Discussed feeding assistance needs with RN.   Labs:  CMP     Component Value Date/Time   NA 135 09/12/2011 0534   K 3.6 09/12/2011 0534   CL 106 09/12/2011 0534   CO2 19 09/12/2011 0534   GLUCOSE 101* 09/12/2011 0534   BUN 19 09/12/2011 0534   CREATININE 0.95 09/12/2011 0534   CALCIUM 8.2* 09/12/2011 0534   PROT 5.6* 09/12/2011 0534   ALBUMIN 2.0* 09/12/2011 0534   AST 17 09/12/2011 0534   ALT <5 09/12/2011 0534   ALKPHOS 63 09/12/2011 0534   BILITOT 0.7 09/12/2011 0534   GFRNONAA 83* 09/12/2011 0534   GFRAA >90 09/12/2011 0534    Intake/Output Summary (Last 24 hours) at 09/12/11 1254 Last data filed at 09/12/11 0900  Gross per 24 hour  Intake    675 ml  Output      0 ml  Net    675 ml    Diet Order: Regular  Supplements/Tube Feeding: N/A  IVF:    Estimated Nutritional Needs:   Kcal: 1800-2100  Protein: 100-110 grams Fluid: 2.0 L  Pt was eating lunch when I spoke to him. Pt had a hard time getting the food onto his fork, due to his hands being very shaky; asked pt if he would rather have foods he could pick up, pt responded he did not. Pt reports no trouble swallowing.   Discussed with pt the importance of eating daily to promote healing and protein repletion.   Pt meets criteria for severe malnutrition in the context of social/environmental circumstances given 17% unintentional weight loss in 1.5 months and PO intake <50% estimated energy needs for > 1 month.  NUTRITION DIAGNOSIS: -Inadequate oral intake (NI-2.1).  Status: Ongoing  RELATED TO: poor appetite, lack of access to food  AS EVIDENCE BY: 17% unintentional weight loss in 1.5 months, PO intake <50% estimated energy needs for >1 month  MONITORING/EVALUATION(Goals): Goal: PO intake >75% of meals and snacks for energy and protein repletion. Prevent any further weight loss.  Monitor: weight trends, labs, PO intake,  I/O's  EDUCATION NEEDS: -No education needs identified at this time  INTERVENTION:  Spoke to RN about providing assistance when eating  Order snack of graham crackers and peanut butter for pt  RD to follow for nutrition care plan  Dietitian #:[phone]  DOCUMENTATION CODES Per approved criteria  -Severe  malnutrition in the context of social or environmental circumstances    Karenann Cai 09/12/2011, 12:49 PM  Anastasio Champion, MS, RD, LDN Pager [phone]

## 2011-09-12 NOTE — Progress Notes (Signed)
Consult for ortho placed in consult log, secretary aware.  Called and completed

## 2011-09-12 NOTE — Telephone Encounter (Signed)
Received call for consult request [facility]ammy[facility] per Dr. Darrol Angel regarding this patient, for Left humerus fracture.  Admit date:  09/11/11.  Patient's Rm#: [id number].  Direct ph# [phone].

## 2011-09-12 NOTE — Clinical Documentation Improvement (Signed)
MALNUTRITION DOCUMENTATION CLARIFICATION  THIS DOCUMENT IS NOT A PERMANENT PART OF THE MEDICAL RECORD  TO RESPOND TO THE THIS QUERY, FOLLOW THE INSTRUCTIONS BELOW:  1. If needed, update documentation for the patient's encounter via the notes activity.  2. Access this query again and click edit on the In [employer].  3. After updating, or not, click F2 to complete all highlighted (required) fields concerning your review. Select "additional documentation in the medical record" OR "no additional documentation provided".  4. Click Sign note button.  5. The deficiency will fall out of your In Basket *Please let us know if you are not able to complete this workflow by phone or e-mail (listed below).  Please update your documentation within the medical record to reflect your response to this query.                                                                                        09/12/11   Dear Dr. Kerry Hough / Associates,  In a better effort to capture your patient's severity of illness, reflect appropriate length of stay and utilization of resources, a review of the patient medical record has revealed the following indicators.    Based on your clinical judgment, please clarify and document in a progress note and/or discharge summary the clinical condition associated with the following supporting information:  In responding to this query please exercise your independent judgment.  The fact that a query is asked, does not imply that any particular answer is desired or expected. Possible Clinical Conditions?  _______Mild Malnutrition  _______Moderate Malnutrition _______Severe Malnutrition   _______Protein Calorie Malnutrition _______Severe Protein Calorie Malnutrition _______Other Condition________________ _______Cannot clinically determine     Clinical Information:  Risk Factors: ETOH Abuse Eats cheese crackers & pepsi only Food from convenience market only Unable to access  food Failure to thrive Metabolic acidosis likely secondary to starvation ketoacidosis Signs & Symptoms: -Ht: 5\' 11"  Wt: 73.1kg  -BMI: 22.48  -Weight unintentional over_1.5 months -Diagnostics:  -Albumin level: 2  -Total Protein: 5.6  -Calcium level: 8.2  Treatments: -Nutrition Consult: Inadequate oral intake related to poor appetite & lack of access to food                               Recommended interventions:                                             Assistance when eating                                             Order snack of graham crackers and peanut butter for pt  RD to follow for nutrition care plan  You may use possible, probable, or suspect with inpatient documentation. possible, probable, suspected diagnoses MUST be documented at the time of discharge  Reviewed: additional documentation in the medical record  Thank You,  Debora T Williams RN, MSN Clinical Documentation Specialist: Office# [phone] [facility] Health Information Management [place]

## 2011-09-12 NOTE — Progress Notes (Signed)
Patient has refused to give consent for the transfusion of 2 units of blood tonight. States that he doesn't know that he ever will. Dr. Jenene Slicker notified at 2023 tonight of patients refusal.

## 2011-09-13 LAB — BASIC METABOLIC PANEL
BUN: 17 mg/dL (ref 6–23)
CO2: 17 mEq/L — ABNORMAL LOW (ref 19–32)
Calcium: 8.5 mg/dL (ref 8.4–10.5)
Creatinine, Ser: 1.11 mg/dL (ref 0.50–1.35)
Glucose, Bld: 122 mg/dL — ABNORMAL HIGH (ref 70–99)
Potassium: 3.6 mEq/L (ref 3.5–5.1)

## 2011-09-13 LAB — TYPE AND SCREEN
Antibody Screen: NEGATIVE
Unit division: 0

## 2011-09-13 LAB — PROTIME-INR
INR: 1.02 (ref 0.00–1.49)
Prothrombin Time: 13.6 seconds (ref 11.6–15.2)

## 2011-09-13 LAB — CBC
MCHC: 32.2 g/dL (ref 30.0–36.0)
Platelets: 160 10*3/uL (ref 150–400)
RDW: 17.5 % — ABNORMAL HIGH (ref 11.5–15.5)
WBC: 2.3 10*3/uL — ABNORMAL LOW (ref 4.0–10.5)

## 2011-09-13 LAB — PREPARE RBC (CROSSMATCH)

## 2011-09-13 MED ORDER — NICOTINE 21 MG/24HR TD PT24
21.0000 mg | MEDICATED_PATCH | Freq: Every day | TRANSDERMAL | Status: DC
  Administered 2011-09-13: 21 mg via TRANSDERMAL
  Filled 2011-09-13 (×2): qty 1

## 2011-09-13 MED ORDER — NYSTATIN 100000 UNIT/GM EX CREA: TOPICAL_CREAM | Freq: Two times a day (BID) | CUTANEOUS | Status: AC

## 2011-09-13 MED ORDER — SULFAMETHOXAZOLE-TMP DS 800-160 MG PO TABS: 1.0000 | ORAL_TABLET | Freq: Two times a day (BID) | ORAL | Status: AC

## 2011-09-13 MED ORDER — HYDROCODONE-ACETAMINOPHEN 5-325 MG PO TABS: 1.0000 | ORAL_TABLET | ORAL | Status: AC | PRN

## 2011-09-13 NOTE — Progress Notes (Signed)
Nursing staff have it reevaluated this man. He cannot get up from his bed and there is concern that he will not do well at all if he were to be discharged home. In the past, the patient has refused to go to any other facility except to go home. I've had a brief conversation with the patient and stressed to him the importance of rehabilitation, safety prior to being discharged home. Therefore, the new plan is to discharge him to a skilled nursing facility, when a bed is available, for rehabilitation. Also, he has now agreed for blood transfusion of 2 units which we will do today.

## 2011-09-13 NOTE — Progress Notes (Signed)
Pt has accepted [facility] bed offer. Awaiting PASARR # to be issued. CSW notified Tami at [facility]. Will continue to follow and assist with d/c to SNF.  Dellie Burns, MSW, [place] [phone]

## 2011-09-13 NOTE — Progress Notes (Signed)
Foley was removed this morning as patient was expected to be discharged today.  Discharge plans changed, pt voided independently at 5 pm - 250 cc.

## 2011-09-13 NOTE — Consult Note (Signed)
Reason for Consult:LEFT SHOULDER PAIN  Referring Physician: MEMON. MD   Jacob Riddle is an 71 y.o. male.  HPI:  This is a 71 year old gentleman who presents to the emergency room today with complaints of generalized weakness and left arm pain. Patient is currently living in a motel for the past 3 years. He also has a history of alcohol abuse. He reports that approximately 4-6 weeks ago he had a fall. He does not remember the circumstances of his fall. Since then he has pain in his left arm. He did not seek any medical attention for it. He also reports that he feels generally weak. His oral intake is very poor for the past few weeks. He continues to drink alcohol. He denies any dizziness, chest pain, fever, cough, palpitations, dysuria, diarrhea, vomiting. He also complains of pain in his feet bilaterally but this has been occurring prior to his fall. He also reports significant pain and itching in his groin area.  He was evaluated in the emergency room he was found to be tachycardic with a heart rate in the 120s and dehydrated. X-rays of his left humerus revealed a fracture. Patient received some IV fluids with improvement in his heart rate. He was noted to have some significant metabolic acidosis with bicarbonate of 15. He is referred for admission.      Past Medical History  Diagnosis Date  . Arthritis   . Gout   . Hypertension     History reviewed. No pertinent past surgical history.  No family history on file.  Social History:  reports that he has been smoking.  He does not have any smokeless tobacco history on file. He reports that he drinks alcohol. He reports that he does not use illicit drugs.  Allergies:  Allergies  Allergen Reactions  . Lisinopril Other (See Comments)    Dizziness    Medications: I have reviewed the patient's current medications.  Results for orders placed during the hospital encounter of 09/11/11 (from the past 48 hour(s))  CBC     Status: Abnormal   Collection Time   09/11/11 12:54 PM      Component Value Range Comment   WBC 5.8  4.0 - 10.5 (K/uL)    RBC 3.22 (*) 4.22 - 5.81 (MIL/uL)    Hemoglobin 9.9 (*) 13.0 - 17.0 (g/dL)    HCT 16.1 (*) 09.6 - 52.0 (%)    MCV 94.7  78.0 - 100.0 (fL)    MCH 30.7  26.0 - 34.0 (pg)    MCHC 32.5  30.0 - 36.0 (g/dL)    RDW 04.5 (*) 40.9 - 15.5 (%)    Platelets 294  150 - 400 (K/uL)   DIFFERENTIAL     Status: Abnormal   Collection Time   09/11/11 12:54 PM      Component Value Range Comment   Neutrophils Relative 78 (*) 43 - 77 (%)    Neutro Abs 4.5  1.7 - 7.7 (K/uL)    Lymphocytes Relative 16  12 - 46 (%)    Lymphs Abs 0.9  0.7 - 4.0 (K/uL)    Monocytes Relative 5  3 - 12 (%)    Monocytes Absolute 0.3  0.1 - 1.0 (K/uL)    Eosinophils Relative 0  0 - 5 (%)    Eosinophils Absolute 0.0  0.0 - 0.7 (K/uL)    Basophils Relative 1  0 - 1 (%)    Basophils Absolute 0.0  0.0 - 0.1 (K/uL)   BASIC METABOLIC  PANEL     Status: Abnormal   Collection Time   09/11/11 12:54 PM      Component Value Range Comment   Sodium 132 (*) 135 - 145 (mEq/L)    Potassium 3.3 (*) 3.5 - 5.1 (mEq/L)    Chloride 95 (*) 96 - 112 (mEq/L)    CO2 15 (*) 19 - 32 (mEq/L)    Glucose, Bld 122 (*) 70 - 99 (mg/dL)    BUN 17  6 - 23 (mg/dL)    Creatinine, Ser 5.78  0.50 - 1.35 (mg/dL)    Calcium 9.6  8.4 - 10.5 (mg/dL)    GFR calc non Af Amer 84 (*) >90 (mL/min)    GFR calc Af Amer >90  >90 (mL/min)   TROPONIN I     Status: Normal   Collection Time   09/11/11 12:54 PM      Component Value Range Comment   Troponin I <0.30  <0.30 (ng/mL)   CK TOTAL AND CKMB     Status: Normal   Collection Time   09/11/11 12:54 PM      Component Value Range Comment   Total CK 33  7 - 232 (U/L)    CK, MB 2.3  0.3 - 4.0 (ng/mL)    Relative Index RELATIVE INDEX IS INVALID  0.0 - 2.5    ETHANOL     Status: Normal   Collection Time   09/11/11 12:54 PM      Component Value Range Comment   Alcohol, Ethyl (B) <11  0 - 11 (mg/dL)   LIPASE, BLOOD     Status:  Normal   Collection Time   09/11/11 12:54 PM      Component Value Range Comment   Lipase 22  11 - 59 (U/L)   VITAMIN B12     Status: Normal   Collection Time   09/11/11 12:54 PM      Component Value Range Comment   Vitamin B-12 285  211 - 911 (pg/mL)   FOLATE     Status: Abnormal   Collection Time   09/11/11 12:54 PM      Component Value Range Comment   Folate 2.7 (*)    IRON AND TIBC     Status: Normal   Collection Time   09/11/11 12:54 PM      Component Value Range Comment   Iron 71  42 - 135 (ug/dL)    TIBC 469  629 - 528 (ug/dL)    Saturation Ratios 23  20 - 55 (%)    UIBC 233  125 - 400 (ug/dL)   FERRITIN     Status: Normal   Collection Time   09/11/11 12:54 PM      Component Value Range Comment   Ferritin 308  22 - 322 (ng/mL)   RETICULOCYTES     Status: Abnormal   Collection Time   09/11/11 12:54 PM      Component Value Range Comment   Retic Ct Pct 3.6 (*) 0.4 - 3.1 (%)    RBC. 3.21 (*) 4.22 - 5.81 (MIL/uL)    Retic Count, Manual 115.6  19.0 - 186.0 (K/uL)   MAGNESIUM     Status: Normal   Collection Time   09/11/11 12:54 PM      Component Value Range Comment   Magnesium 1.7  1.5 - 2.5 (mg/dL)   URINALYSIS, ROUTINE W REFLEX MICROSCOPIC     Status: Abnormal   Collection Time   09/11/11  2:20  PM      Component Value Range Comment   Color, Urine AMBER (*) YELLOW  BIOCHEMICALS MAY BE AFFECTED BY COLOR   APPearance CLEAR  CLEAR     Specific Gravity, Urine >1.030 (*) 1.005 - 1.030     pH 5.5  5.0 - 8.0     Glucose, UA 100 (*) NEGATIVE (mg/dL)    Hgb urine dipstick SMALL (*) NEGATIVE     Bilirubin Urine MODERATE (*) NEGATIVE     Ketones, ur 15 (*) NEGATIVE (mg/dL)    Protein, ur 30 (*) NEGATIVE (mg/dL)    Urobilinogen, UA 4.0 (*) 0.0 - 1.0 (mg/dL)    Nitrite NEGATIVE  NEGATIVE     Leukocytes, UA SMALL (*) NEGATIVE    URINE RAPID DRUG SCREEN (HOSP PERFORMED)     Status: Normal   Collection Time   09/11/11  2:20 PM      Component Value Range Comment   Opiates NONE DETECTED   NONE DETECTED     Cocaine NONE DETECTED  NONE DETECTED     Benzodiazepines NONE DETECTED  NONE DETECTED     Amphetamines NONE DETECTED  NONE DETECTED     Tetrahydrocannabinol NONE DETECTED  NONE DETECTED     Barbiturates NONE DETECTED  NONE DETECTED    URINE MICROSCOPIC-ADD ON     Status: Abnormal   Collection Time   09/11/11  2:20 PM      Component Value Range Comment   Squamous Epithelial / LPF FEW (*) RARE     WBC, UA 3-6  <3 (WBC/hpf)    RBC / HPF 3-6  <3 (RBC/hpf)    Bacteria, UA FEW (*) RARE     Casts HYALINE CASTS (*) NEGATIVE    OCCULT BLOOD, POC DEVICE     Status: Normal   Collection Time   09/11/11  2:28 PM      Component Value Range Comment   Fecal Occult Bld NEGATIVE     CBC     Status: Abnormal   Collection Time   09/12/11  5:34 AM      Component Value Range Comment   WBC 2.0 (*) 4.0 - 10.5 (K/uL)    RBC 2.39 (*) 4.22 - 5.81 (MIL/uL)    Hemoglobin 7.3 (*) 13.0 - 17.0 (g/dL)    HCT 86.5 (*) 78.4 - 52.0 (%)    MCV 95.0  78.0 - 100.0 (fL)    MCH 30.5  26.0 - 34.0 (pg)    MCHC 32.2  30.0 - 36.0 (g/dL)    RDW 69.6 (*) 29.5 - 15.5 (%)    Platelets 140 (*) 150 - 400 (K/uL) DELTA CHECK NOTED  COMPREHENSIVE METABOLIC PANEL     Status: Abnormal   Collection Time   09/12/11  5:34 AM      Component Value Range Comment   Sodium 135  135 - 145 (mEq/L)    Potassium 3.6  3.5 - 5.1 (mEq/L)    Chloride 106  96 - 112 (mEq/L) DELTA CHECK NOTED   CO2 19  19 - 32 (mEq/L)    Glucose, Bld 101 (*) 70 - 99 (mg/dL)    BUN 19  6 - 23 (mg/dL)    Creatinine, Ser 2.84  0.50 - 1.35 (mg/dL)    Calcium 8.2 (*) 8.4 - 10.5 (mg/dL)    Total Protein 5.6 (*) 6.0 - 8.3 (g/dL)    Albumin 2.0 (*) 3.5 - 5.2 (g/dL)    AST 17  0 - 37 (U/L)  ALT <5  0 - 53 (U/L)    Alkaline Phosphatase 63  39 - 117 (U/L)    Total Bilirubin 0.7  0.3 - 1.2 (mg/dL)    GFR calc non Af Amer 83 (*) >90 (mL/min)    GFR calc Af Amer >90  >90 (mL/min)   TYPE AND SCREEN     Status: Normal   Collection Time   09/12/11  6:55 PM       Component Value Range Comment   ABO/RH(D) O POS      Antibody Screen NEG      Sample Expiration 09/15/2011     PREPARE RBC (CROSSMATCH)     Status: Normal (Preliminary result)   Collection Time   09/12/11  6:55 PM      Component Value Range Comment   Order Confirmation ORDER PROCESSED BY BLOOD BANK     ABO/RH     Status: Normal   Collection Time   09/12/11  6:55 PM      Component Value Range Comment   ABO/RH(D) O POS     CBC     Status: Abnormal   Collection Time   09/13/11  4:39 AM      Component Value Range Comment   WBC 2.3 (*) 4.0 - 10.5 (K/uL)    RBC 2.47 (*) 4.22 - 5.81 (MIL/uL)    Hemoglobin 7.7 (*) 13.0 - 17.0 (g/dL)    HCT 16.1 (*) 09.6 - 52.0 (%)    MCV 96.8  78.0 - 100.0 (fL)    MCH 31.2  26.0 - 34.0 (pg)    MCHC 32.2  30.0 - 36.0 (g/dL)    RDW 04.5 (*) 40.9 - 15.5 (%)    Platelets 160  150 - 400 (K/uL)   BASIC METABOLIC PANEL     Status: Abnormal   Collection Time   09/13/11  4:39 AM      Component Value Range Comment   Sodium 132 (*) 135 - 145 (mEq/L)    Potassium 3.6  3.5 - 5.1 (mEq/L)    Chloride 105  96 - 112 (mEq/L)    CO2 17 (*) 19 - 32 (mEq/L)    Glucose, Bld 122 (*) 70 - 99 (mg/dL)    BUN 17  6 - 23 (mg/dL)    Creatinine, Ser 8.11  0.50 - 1.35 (mg/dL)    Calcium 8.5  8.4 - 10.5 (mg/dL)    GFR calc non Af Amer 66 (*) >90 (mL/min)    GFR calc Af Amer 76 (*) >90 (mL/min)     Dg Chest 1 View  09/11/2011  *RADIOLOGY REPORT*  Clinical Data: Left shoulder pain, fell 1 month ago  CHEST - 1 VIEW  Comparison: 11/06/2003  Findings: Normal heart size, mediastinal contours, and pulmonary vascularity. Atherosclerotic calcification aorta. Lungs appear emphysematous but clear. No pleural effusion or pneumothorax. Fracture posterior left seventh rib, suspect old. No acute osseous abnormalities.  IMPRESSION: Emphysematous changes. No acute abnormalities.  Original Report Authenticated By: Lollie Marrow, M.D.   Dg Shoulder Left  09/11/2011  *RADIOLOGY REPORT*  Clinical Data:  Larey Seat 1 month ago, pain  LEFT SHOULDER - 2+ VIEW  Comparison: None.  Findings: There is a comminuted fracture of the humeral head which extends to involve the surgical neck.   Slight medial angulation is present.  No glenohumeral dislocation is present. Acromioclavicular joint is intact.  No visible rib or scapular fractures.  IMPRESSION: Comminuted fracture of the humeral head associated with slightly angulated surgical neck  fracture.  Original Report Authenticated By: Elsie Stain, M.D.    Review of Systems  Unable to perform ROS: other   Blood pressure 155/78, pulse 87, temperature 97.2 F (36.2 C), temperature source Axillary, resp. rate 18, height 5\' 11"  (1.803 m), weight 78.6 kg (173 lb 4.5 oz), SpO2 100.00%. Physical Exam  Constitutional: He is oriented to person, place, and time. He appears well-developed and well-nourished. No distress.  HENT:  Head: Normocephalic and atraumatic.  Eyes: Pupils are equal, round, and reactive to light.  Neck: Normal range of motion.  Cardiovascular: Normal rate.   Respiratory: Effort normal.  GI: Soft.  Musculoskeletal:       Right shoulder: He exhibits normal range of motion, no tenderness, no bony tenderness, no swelling, no effusion, no crepitus, no deformity, no pain, no spasm, normal pulse and normal strength.       Left shoulder: He exhibits decreased range of motion, tenderness, bony tenderness, swelling, crepitus and pain. He exhibits no deformity, no laceration, no spasm, normal pulse and normal strength.       Right hip: Normal.       Left hip: Normal.  Neurological: He is alert and oriented to person, place, and time. He exhibits normal muscle tone.  Skin: Skin is warm and dry.  Psychiatric: He has a normal mood and affect. His behavior is normal. Thought content normal.    Assessment/Plan: A left proximal humerus subacute fracture   ? Age   Sling for comfort the fracture is < 45 degrees angulated and < 1 cm displaced so non  surgical treatment is recommended. Also he is not a surgical candidate   Sling x 3 weeks then rehab x 6 weeks  Fuller Canada 09/13/2011, 7:52 AM

## 2011-09-13 NOTE — Progress Notes (Signed)
Physical Therapy Treatment Patient Details Name: Saulo Anthis MRN: [id number] DOB: 1941-04-16 Today's Date: 09/13/2011  PT Assessment/Plan  Pt to begin transfers next treatment PT Goals  Acute Rehab PT Goals PT Goal: Supine/Side to Sit - Progress: Met PT Goal: Sit to Supine/Side - Progress: Progressing toward goal PT Goal: Sit to Stand - Progress: Progressing toward goal PT Goal: Stand to Sit - Progress: Progressing toward goal PT Transfer Goal: Bed to Chair/Chair to Bed - Progress: Not met PT Goal: Ambulate - Progress: Not met  PT Treatment Precautions/Restrictions  Precautions Precautions: Fall Restrictions Weight Bearing Restrictions: No Other Position/Activity Restrictions: L arm sling Mobility (including Balance) Bed Mobility Supine to Sit: 4: Min assist Sitting - Scoot to Edge of Bed: 5: Supervision Sit to Supine: 3: Mod assist Transfers Transfers: Yes Sit to Stand: 3: Mod assist x 2 to improve endurance Stand to Sit: 4: Min assist Ambulation/Gait Ambulation/Gait: No Stairs: No    Exercise  General Exercises - Lower Extremity Ankle Circles/Pumps: AROM;Both;10 reps Quad Sets: AROM;Both;10 reps Heel Slides: AROM;Both;10 reps Hip ABduction/ADduction: AROM;Both;10 reps End of Session PT - End of Session Equipment Utilized During Treatment: Gait belt Activity Tolerance: Patient tolerated treatment well Patient left: in bed;with call bell in reach;with bed alarm set Nurse Communication: Mobility status for transfers General Behavior During Session: [facility] for tasks performed Cognition: [facility] for tasks performed  Deshay Blumenfeld,CINDY 09/13/2011, 2:22 PM

## 2011-09-13 NOTE — Progress Notes (Signed)
CSW re-consulted for SNF. Met with pt who is now agreeable to SNF search in [facility] for [employer]. CSW will f/u with offers.  Dellie Burns, MSW, [place] [phone]

## 2011-09-13 NOTE — Discharge Summary (Signed)
Physician Discharge Summary  Patient ID: Jacob Riddle MRN: [id number] DOB/AGE: [birth year] 71 y.o. Primary Care Physician:HALL,ZACK, MD, MD Admit date: 09/11/2011 Discharge date: 09/13/2011    Discharge Diagnoses:  1. Weakness secondary to anemia and deconditioning. 2. Left proximal humerus subacute fracture, no indication for surgery. The patient was seen by Dr. Romeo Apple, orthopedics, who recommends keeping the arm in a sling for 3 weeks and then rehabilitation for further 6 weeks. Home health physical therapy. 3. Ongoing alcohol abuse. 4. Cellulitis and fungal infection in the groin. 5. Protein calorie malnutrition. 6. Anemia, leukopenia. Hemoglobin 7.7, white blood cell count 2.3, likely secondary to alcohol abuse. Patient refuses blood transfusion.   Medication List  As of 09/13/2011  8:58 AM   TAKE these medications         HYDROcodone-acetaminophen 5-325 MG per tablet   Commonly known as: NORCO   Take 1-2 tablets by mouth every 4 (four) hours as needed.      nystatin cream   Commonly known as: MYCOSTATIN   Apply topically 2 (two) times daily.      sulfamethoxazole-trimethoprim 800-160 MG per tablet   Commonly known as: BACTRIM DS   Take 1 tablet by mouth every 12 (twelve) hours.            Discharged Condition: Stable.     Consults: None.  Significant Diagnostic Studies: Dg Chest 1 View  09/11/2011  *RADIOLOGY REPORT*  Clinical Data: Left shoulder pain, fell 1 month ago  CHEST - 1 VIEW  Comparison: 11/06/2003  Findings: Normal heart size, mediastinal contours, and pulmonary vascularity. Atherosclerotic calcification aorta. Lungs appear emphysematous but clear. No pleural effusion or pneumothorax. Fracture posterior left seventh rib, suspect old. No acute osseous abnormalities.  IMPRESSION: Emphysematous changes. No acute abnormalities.  Original Report Authenticated By: Lollie Marrow, M.D.   Dg Shoulder Left  09/11/2011  *RADIOLOGY REPORT*  Clinical Data: Larey Seat 1 month  ago, pain  LEFT SHOULDER - 2+ VIEW  Comparison: None.  Findings: There is a comminuted fracture of the humeral head which extends to involve the surgical neck.   Slight medial angulation is present.  No glenohumeral dislocation is present. Acromioclavicular joint is intact.  No visible rib or scapular fractures.  IMPRESSION: Comminuted fracture of the humeral head associated with slightly angulated surgical neck fracture.  Original Report Authenticated By: Elsie Stain, M.D.    Lab Results: Basic Metabolic Panel:  Basename 09/13/11 0439 09/12/11 0534 09/11/11 1254  NA 132* 135 --  K 3.6 3.6 --  CL 105 106 --  CO2 17* 19 --  GLUCOSE 122* 101* --  BUN 17 19 --  CREATININE 1.11 0.95 --  CALCIUM 8.5 8.2* --  MG -- -- 1.7  PHOS -- -- --   Liver Function Tests:  [facility] 09/12/11 0534  AST 17  ALT <5  ALKPHOS 63  BILITOT 0.7  PROT 5.6*  ALBUMIN 2.0*     CBC:  Basename 09/13/11 0439 09/12/11 0534 09/11/11 1254  WBC 2.3* 2.0* --  NEUTROABS -- -- 4.5  HGB 7.7* 7.3* --  HCT 23.9* 22.7* --  MCV 96.8 95.0 --  PLT 160 140* --       Hospital Course: This 71 year old man was admitted with weakness and was found to be anemic and also have a subacute left proximal humeral fracture. There was no evidence of GI bleeding. He is iron, folate and B12 studies were essentially unremarkable and in the normal range. He was offered blood transfusion because his  hemoglobin dropped to 7.3 after hydration. He unfortunately refused. He was found to have a cellulitis in the groin area which was a combination of bacterial and fungal infection. He is on antibiotics and antifungal cream. He stable to be discharged but will need home health physical therapy.  Discharge Exam: Blood pressure 155/78, pulse 87, temperature 97.2 F (36.2 C), temperature source Axillary, resp. rate 18, height 5\' 11"  (1.803 m), weight 78.6 kg (173 lb 4.5 oz), SpO2 100.00%. He looks systemically well. Is not toxic or septic.  Heart sounds are present and normal. Lung fields are clear. Despite his heavy alcohol consumption, he does not appear to have signs of chronic liver disease clinically. He is not in withdrawal clinically. He is alert and orientated. Abdomen is soft and nontender with no evidence of hepatosplenomegaly. He has a cellulitic/fungal rash in both groins.  Disposition: Home with home health physical therapy. He'll be given a further course of antibiotics to complete. I have emphasized the importance of following up with the primary care physician Dr. Margo Aye and also to quit drinking alcohol. Unfortunately, I'm not convinced he will quit at this time.  Discharge Orders    Future Orders Please Complete By Expires   Diet - low sodium heart healthy      Increase activity slowly         Follow-up Information    Follow up with [facility], MD. Schedule an appointment as soon as possible for a visit in 1 week.         SignedWilson Singer Pager [phone]  09/13/2011, 8:58 AM

## 2011-09-13 NOTE — Progress Notes (Signed)
Pt discharged back to hotel that he was living at prior to admission with home health in place.  Foley removed per MD orders.  Pt instructed on new medications and how to take them.  Pt verbalizes understanding.  Pt advised to quit using alcohol.

## 2011-09-13 NOTE — Progress Notes (Signed)
CARE MANAGEMENT NOTE 09/13/2011  Patient:  Jacob Riddle, Jacob Riddle   Account Number:  [id number]  Date Initiated:  09/13/2011  Documentation initiated by:  Rosemary Holms  Subjective/Objective Assessment:   Pt admitted from a extended stay hotel where he has lived alone and independent. Has been in bed for quite a while and admitted withhypertension and pain.     Action/Plan:   Spoke to pt and he plans to return back to his home. HH needs were identifed and ordered.   Anticipated DC Date:  09/14/2011   Anticipated DC Plan:  SKILLED NURSING FACILITY  In-house referral  Clinical Social Worker      DC Planning Services  CM consult      Choice offered to / List presented to:  C-1 Patient           Status of service:  In process, will continue to follow Medicare Important Message given?   (If response is "NO", the following Medicare IM given date fields will be blank) Date Medicare IM given:   Date Additional Medicare IM given:    Discharge Disposition:    Per UR Regulation:    Comments:  09/13/11 1200 Adamari Frede RN BSN CM At the time of discharge to home with [facility], pt was unable to stand. Very weak. Since he has no one to care for him at home or to transport him from the ambulance to his room safely, MD notified that DC home was not safe. Pt was presented with this and has now agreed to SNF placement to regain strenght. CSW will seek  bed offers.

## 2011-09-14 ENCOUNTER — Inpatient Hospital Stay
Admission: RE | Admit: 2011-09-14 | Discharge: 2011-11-29 | Disposition: A | Discharge status: Home / Self Care | Payer: Self-pay | Source: Ambulatory Visit | Attending: Internal Medicine | Admitting: Internal Medicine

## 2011-09-14 DIAGNOSIS — M1990 Unspecified osteoarthritis, unspecified site: Principal | ICD-10-CM

## 2011-09-14 DIAGNOSIS — S4292XA Fracture of left shoulder girdle, part unspecified, initial encounter for closed fracture: Secondary | ICD-10-CM

## 2011-09-14 DIAGNOSIS — T148XXA Other injury of unspecified body region, initial encounter: Secondary | ICD-10-CM

## 2011-09-14 LAB — CBC
HCT: 27.5 % — ABNORMAL LOW (ref 39.0–52.0)
MCV: 94.5 fL (ref 78.0–100.0)
RBC: 2.91 MIL/uL — ABNORMAL LOW (ref 4.22–5.81)
RDW: 16.4 % — ABNORMAL HIGH (ref 11.5–15.5)
WBC: 1.6 10*3/uL — ABNORMAL LOW (ref 4.0–10.5)

## 2011-09-14 LAB — TYPE AND SCREEN
Antibody Screen: NEGATIVE
Unit division: 0

## 2011-09-14 LAB — COMPREHENSIVE METABOLIC PANEL
BUN: 12 mg/dL (ref 6–23)
CO2: 17 mEq/L — ABNORMAL LOW (ref 19–32)
Chloride: 105 mEq/L (ref 96–112)
Creatinine, Ser: 0.84 mg/dL (ref 0.50–1.35)
GFR calc Af Amer: >90 mL/min (ref >90)
GFR calc non Af Amer: 87 mL/min — ABNORMAL LOW (ref >90)
Total Bilirubin: 0.5 mg/dL (ref 0.3–1.2)

## 2011-09-14 LAB — HSV(HERPES SMPLX)ABS-I+II(IGG+IGM)-BLD: HSV 1 Glycoprotein G Ab, IgG: 2.74 IV — ABNORMAL HIGH

## 2011-09-14 MED ORDER — NICOTINE 21 MG/24HR TD PT24: 1.0000 | MEDICATED_PATCH | Freq: Every day | TRANSDERMAL | Status: AC

## 2011-09-14 MED ORDER — OXYCODONE HCL 5 MG PO TABS: 5.0000 mg | ORAL_TABLET | ORAL | Status: AC | PRN

## 2011-09-14 MED ORDER — LORAZEPAM 0.5 MG PO TABS: 0.5000 mg | ORAL_TABLET | Freq: Three times a day (TID) | ORAL | Status: AC

## 2011-09-14 MED ORDER — FOLIC ACID 1 MG PO TABS: 1.0000 mg | ORAL_TABLET | Freq: Every day | ORAL | Status: AC

## 2011-09-14 NOTE — Progress Notes (Addendum)
Pt d/c today by MD to [facility].  Pt and facility aware and agreeable and pt to complete admission paperwork.  Pt will transfer with RN.  D/C summary faxed to [facility] and [facility].  CSW notified Melanie at DSS of d/c to [facility].   Karn Cassis

## 2011-09-14 NOTE — Progress Notes (Signed)
Patient discharged to [facility] per wheelchair. Sling to left arm. Rep[ort called to center

## 2011-09-14 NOTE — Discharge Summary (Signed)
Physician Discharge Summary  Patient ID: Jacob Riddle MRN: [id number] DOB/AGE: 08-24-1940 71 y.o. Primary Care Physician:HALL,ZACK, MD, MD Admit date: 09/11/2011 Discharge date: 09/14/2011    Discharge Diagnoses:   1. Weakness secondary to anemia and deconditioning. 2. Left proximal humerus subacute fracture, no indication for surgery. The patient was seen by Dr. Romeo Apple, orthopedics, who recommends keeping the arm in a sling for 3 weeks and then rehabilitation for further 6 weeks. 3. Ongoing alcohol abuse. Now on Ativan twice a day. 4. Cellulitis and fungal infection in the groin. 5. Protein calorie malnutrition. 6. Anemia, leukopenia. Status post 2 units blood transfusion. These abnormalities are likely due to alcohol abuse.   Medication List  As of 09/14/2011  8:30 AM   TAKE these medications         folic acid 1 MG tablet   Commonly known as: FOLVITE   Take 1 tablet (1 mg total) by mouth daily.      HYDROcodone-acetaminophen 5-325 MG per tablet   Commonly known as: NORCO   Take 1-2 tablets by mouth every 4 (four) hours as needed.      LORazepam 0.5 MG tablet   Commonly known as: ATIVAN   Take 1 tablet (0.5 mg total) by mouth every 8 (eight) hours.      nicotine 21 mg/24hr patch   Commonly known as: NICODERM CQ - dosed in mg/24 hours   Place 1 patch onto the skin daily.      nystatin cream   Commonly known as: MYCOSTATIN   Apply topically 2 (two) times daily.      oxyCODONE 5 MG immediate release tablet   Commonly known as: Oxy IR/ROXICODONE   Take 1 tablet (5 mg total) by mouth every 4 (four) hours as needed for pain.      sulfamethoxazole-trimethoprim 800-160 MG per tablet   Commonly known as: BACTRIM DS   Take 1 tablet by mouth every 12 (twelve) hours.            Discharged Condition: Stable.     Consults: None.  Significant Diagnostic Studies: Dg Chest 1 View  09/11/2011  *RADIOLOGY REPORT*  Clinical Data: Left shoulder pain, fell 1 month ago  CHEST -  1 VIEW  Comparison: 11/06/2003  Findings: Normal heart size, mediastinal contours, and pulmonary vascularity. Atherosclerotic calcification aorta. Lungs appear emphysematous but clear. No pleural effusion or pneumothorax. Fracture posterior left seventh rib, suspect old. No acute osseous abnormalities.  IMPRESSION: Emphysematous changes. No acute abnormalities.  Original Report Authenticated By: Lollie Marrow, M.D.   Dg Shoulder Left  09/11/2011  *RADIOLOGY REPORT*  Clinical Data: Larey Seat 1 month ago, pain  LEFT SHOULDER - 2+ VIEW  Comparison: None.  Findings: There is a comminuted fracture of the humeral head which extends to involve the surgical neck.   Slight medial angulation is present.  No glenohumeral dislocation is present. Acromioclavicular joint is intact.  No visible rib or scapular fractures.  IMPRESSION: Comminuted fracture of the humeral head associated with slightly angulated surgical neck fracture.  Original Report Authenticated By: Elsie Stain, M.D.    Lab Results: Basic Metabolic Panel:  Basename 09/14/11 0508 09/13/11 0439 09/11/11 1254  NA 132* 132* --  K 3.9 3.6 --  CL 105 105 --  CO2 17* 17* --  GLUCOSE 97 122* --  BUN 12 17 --  CREATININE 0.84 1.11 --  CALCIUM 8.1* 8.5 --  MG -- -- 1.7  PHOS -- -- --   Liver Function Tests:  Basename 09/14/11 0508 09/12/11 0534  AST 28 17  ALT 7 <5  ALKPHOS 91 63  BILITOT 0.5 0.7  PROT 5.9* 5.6*  ALBUMIN 2.0* 2.0*     CBC:  Basename 09/14/11 0508 09/13/11 0439 09/11/11 1254  WBC 1.6* 2.3* --  NEUTROABS -- -- 4.5  HGB 9.2* 7.7* --  HCT 27.5* 23.9* --  MCV 94.5 96.8 --  PLT 130* 160 --       Hospital Course: This 71 year old man was admitted with weakness and was found to be anemic and also had a subacute left proximal humeral fracture. There was no evidence of GI bleeding. His iron, folate and B12 studies were essentially within the normal range. He eventually agreed for a blood transfusion as his hemoglobin was down  to 7.3. This was after hydration. He  also was found to have cellulitis in bilateral groin areas which is a combination of bacterial and fungal infection. He was started on antibiotics and antifungal cream which he should continue. In view of his alcoholism, he was put on alcohol withdrawal protocol and now he should be maintained on Ativan twice a day as indicated above. This can be slowly weaned off as he rehabilitates.  Discharge Exam: Blood pressure 161/77, pulse 77, temperature 98.1 F (36.7 C), temperature source Oral, resp. rate 18, height 5\' 11"  (1.803 m), weight 80.6 kg (177 lb 11.1 oz), SpO2 100.00%. He looks unkempt and chronically sick. Fortunately, he does not have any evidence clinically of liver disease. Heart sounds are present and normal. Lung fields are clear. Abdomen is soft nontender. There is no splenomegaly. He is alert and orientated. There is no evidence of hepatic encephalopathy or alcohol withdrawal syndrome. His toes look unhealthy with large nails curling over.  Disposition: Skilled nursing facility for rehabilitation. Hopefully after this he can be discharged to home. I would recommend a podiatrist to look at his feet. Also, he will need a close monitoring of the bilateral groin infection.  Discharge Orders    Future Orders Please Complete By Expires   Diet - low sodium heart healthy      Diet - low sodium heart healthy      Increase activity slowly      Increase activity slowly         Follow-up Information    Follow up with [facility], [place]. Schedule an appointment as soon as possible for a visit on 09/20/2011. (2 pm)          SignedWilson Singer Pager [phone]  09/14/2011, 8:30 AM

## 2011-09-18 ENCOUNTER — Ambulatory Visit (HOSPITAL_COMMUNITY)
Admit: 2011-09-18 | Discharge: 2011-09-18 | Disposition: A | Discharge status: Home / Self Care | Payer: Medicare Other | Attending: Internal Medicine | Admitting: Internal Medicine

## 2011-10-12 NOTE — ED Notes (Signed)
Ros- all other systems reviewed and negative.   Hilario Quarry, MD 10/12/11 [phone]

## 2011-10-17 ENCOUNTER — Telehealth: Payer: Self-pay | Admitting: Orthopedic Surgery

## 2011-10-17 NOTE — Telephone Encounter (Signed)
Nurse, Idell Pickles,  from [facility] called, ph [phone], to ask about recommendations for left shoulder fracture, DOI 09/11/11.  States that last note indicates sling 3 weeks then follow up 6 weeks.  Asking if repeat Xray before rehab?  She is to fax notes, as I am not locating consult notes in system.  Please advise.

## 2011-10-17 NOTE — Telephone Encounter (Signed)
If she has a note send it to [place]

## 2011-10-18 NOTE — Telephone Encounter (Signed)
Dr. Romeo Apple - note received from [facility] per Vikki Ports, nurse.  Verbal order given to have repeat Xray ordered by physician at [facility], prior to rehab/physical therapy for patient.   Written order / signed note needed for [facility].

## 2011-10-22 NOTE — Telephone Encounter (Signed)
10/22/11 Nurse from [facility] called back to request orders per last note.  Forwarded to nurse.

## 2011-10-23 ENCOUNTER — Telehealth: Payer: Self-pay | Admitting: *Deleted

## 2011-10-23 NOTE — Telephone Encounter (Signed)
error 

## 2011-10-24 ENCOUNTER — Other Ambulatory Visit: Payer: Self-pay | Admitting: *Deleted

## 2011-10-24 DIAGNOSIS — S4292XA Fracture of left shoulder girdle, part unspecified, initial encounter for closed fracture: Secondary | ICD-10-CM

## 2011-10-24 NOTE — Telephone Encounter (Signed)
Orders entered, for Xray, per nurse,per Dr. Romeo Apple.  Faxed to [facility].

## 2011-10-24 NOTE — Telephone Encounter (Signed)
10/24/11 Idell Pickles, nurse from [facility] called back.  There was verbal order per Dr. Romeo Apple 10/18/11 for the patient to have repeat Xray of his Left shoulder, prior to rehab, for Dr. Romeo Apple to see. Dr. Romeo Apple - again forwarding:  Please sign off on this note.  Idell Pickles is now asking for an Xray order from Dr. Romeo Apple.  Or is [facility] to have their physician order the Xray?  The phone # at [facility] is [phone].

## 2011-10-25 ENCOUNTER — Ambulatory Visit (HOSPITAL_COMMUNITY): Payer: Medicare Other | Attending: Orthopedic Surgery

## 2011-10-26 ENCOUNTER — Telehealth: Payer: Self-pay | Admitting: Orthopedic Surgery

## 2011-10-26 DIAGNOSIS — S4292XA Fracture of left shoulder girdle, part unspecified, initial encounter for closed fracture: Secondary | ICD-10-CM

## 2011-10-26 NOTE — Telephone Encounter (Signed)
Idell Pickles, nurse from [facility] [phone], called to follow up about Xray results.  Please contact at this phone number.

## 2011-10-27 NOTE — Telephone Encounter (Signed)
X RAYS REVIEWED FRACTURE HAS HEALED   START PT OR OT LEFT SHOULDER FRX Passive and active assisted range of motion left shoulder 3 times a week for 6 weeks

## 2011-10-29 NOTE — Telephone Encounter (Signed)
[facility]ed [facility] and PT at [facility]

## 2011-12-17 ENCOUNTER — Telehealth: Payer: Self-pay | Admitting: Orthopedic Surgery

## 2011-12-17 NOTE — Telephone Encounter (Signed)
Georgiann Hahn with [facility] left a message on Friday afternoon at 3:26 that [facility] had BP reading of  200/104.  Said he told her he had not taken his BP medicine that day.  She said she just wanted you to know this.   Her # [phone]

## 2011-12-18 NOTE — Telephone Encounter (Signed)
Refer any medical questions to primary care physician

## 2011-12-18 NOTE — Telephone Encounter (Signed)
Relayed Dr. Mort Sawyers reply to Kathyrn/[facility]

## 2012-01-01 ENCOUNTER — Telehealth: Payer: Self-pay | Admitting: Orthopedic Surgery

## 2012-01-01 NOTE — Telephone Encounter (Signed)
Bruce/[facility] left a message 12/28/11, requesting to continue OT for Jacob Riddle's shoulder 2 times a week for 3 weeks. Bruce's # [phone]

## 2012-01-01 NOTE — Telephone Encounter (Signed)
Verbal order given  

## 2012-01-01 NOTE — Telephone Encounter (Signed)
Approved.  

## 2012-04-23 IMAGING — CR DG SHOULDER 2+V*L*
3 series · 3 of 3 positions shown · non-contrast
Comparison: Left shoulder radiographs 09/11/2011.

CLINICAL DATA: No shoulder fracture.

LEFT SHOULDER - 2+ VIEW

[view not recorded (1 of 3)]
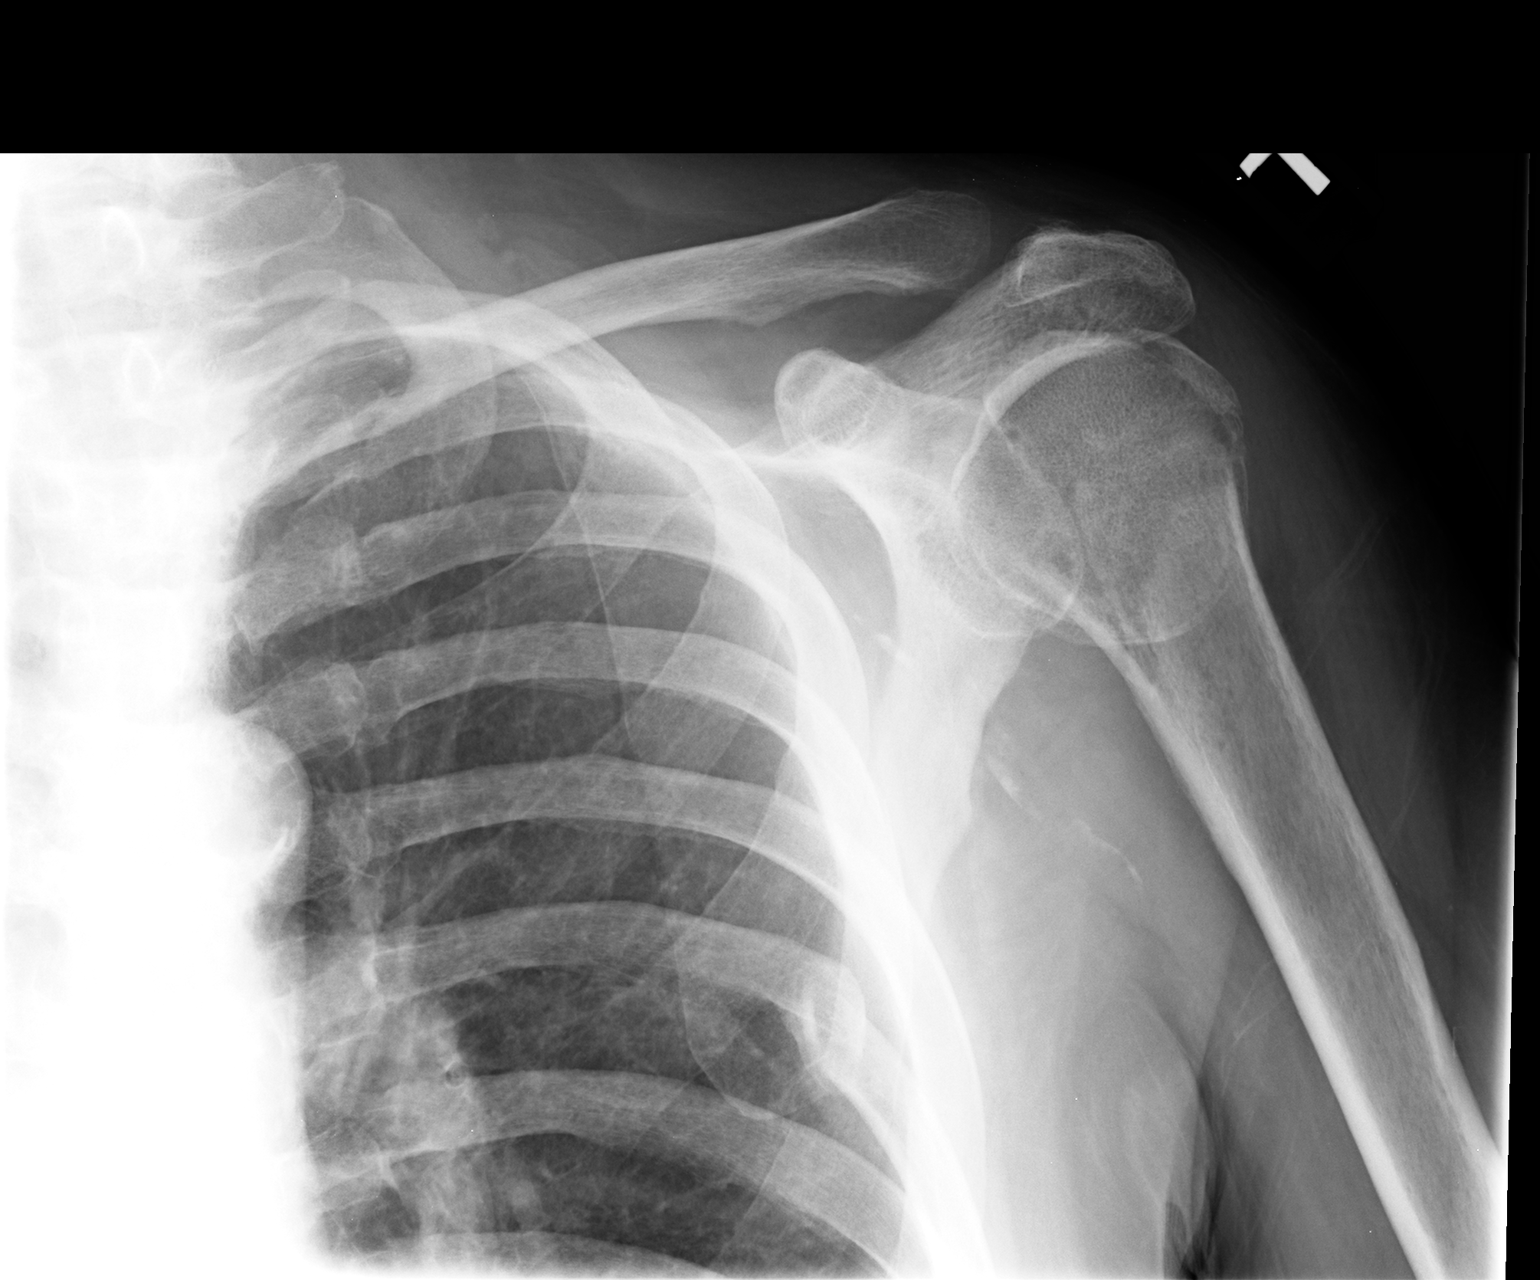

[view not recorded (2 of 3)]
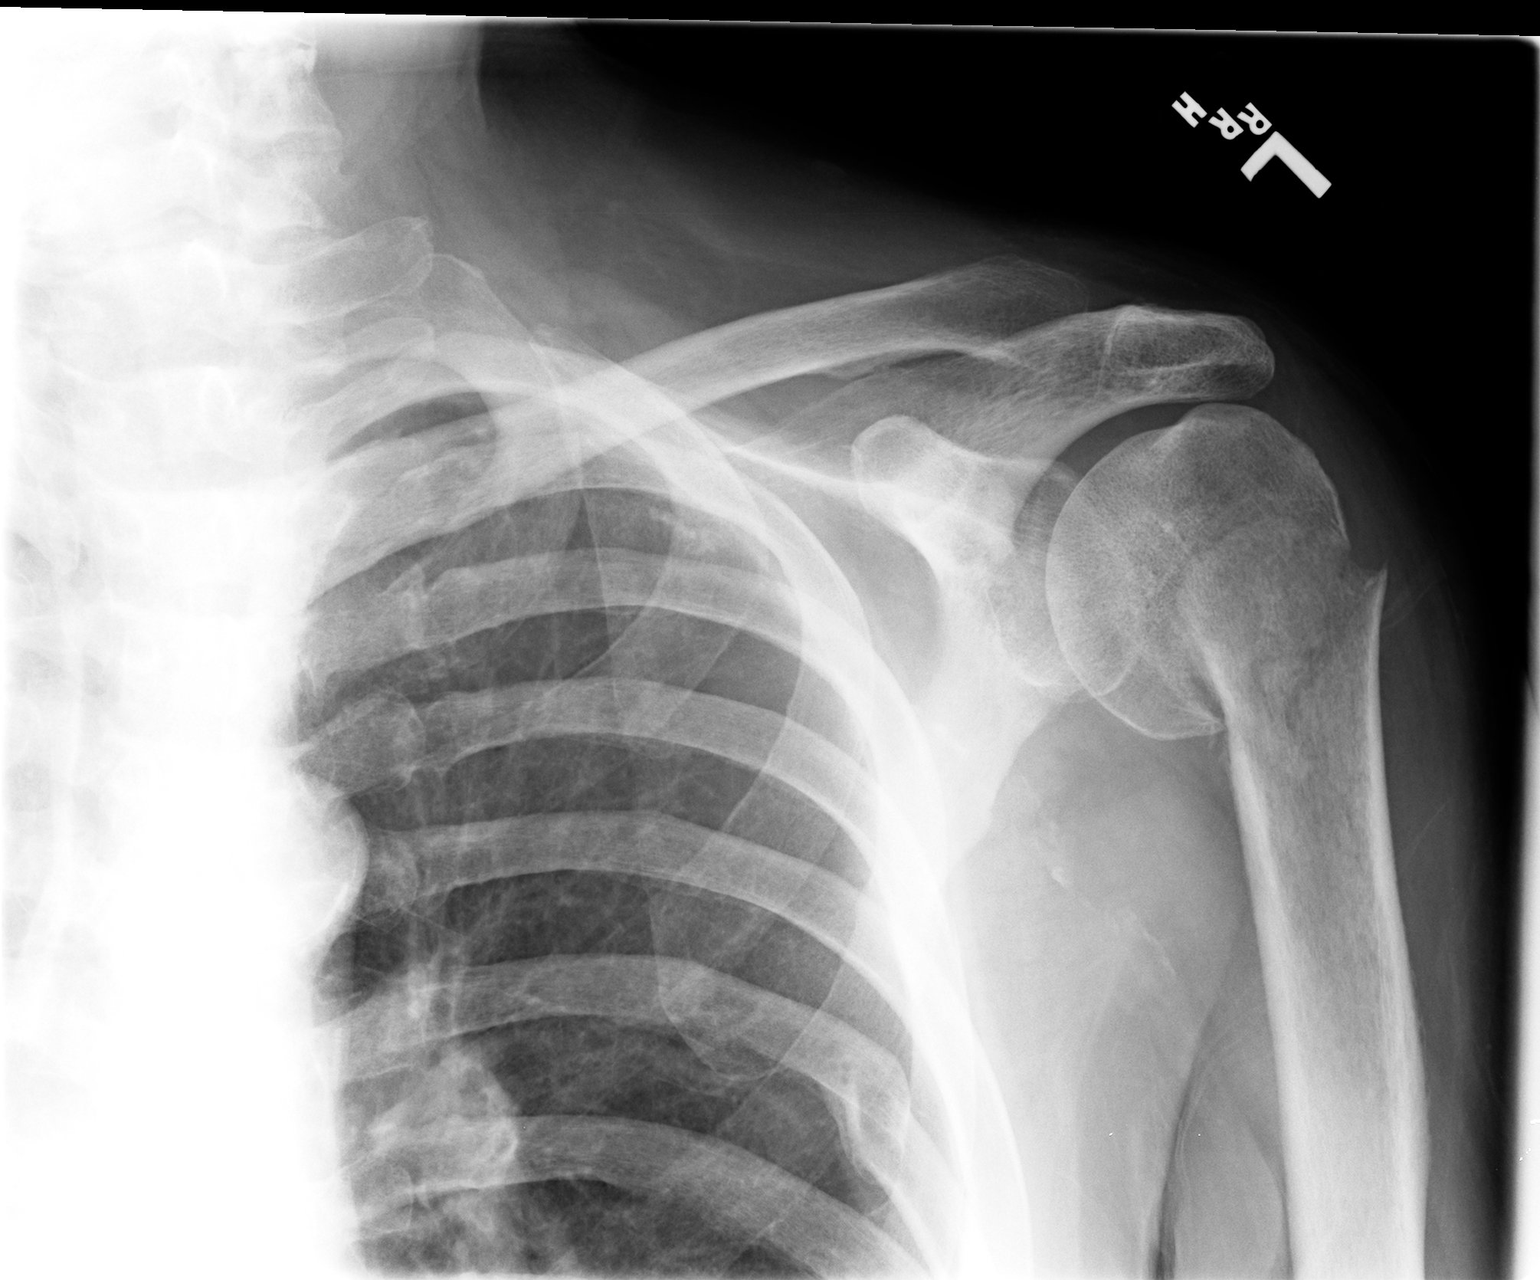

[view not recorded (3 of 3)]
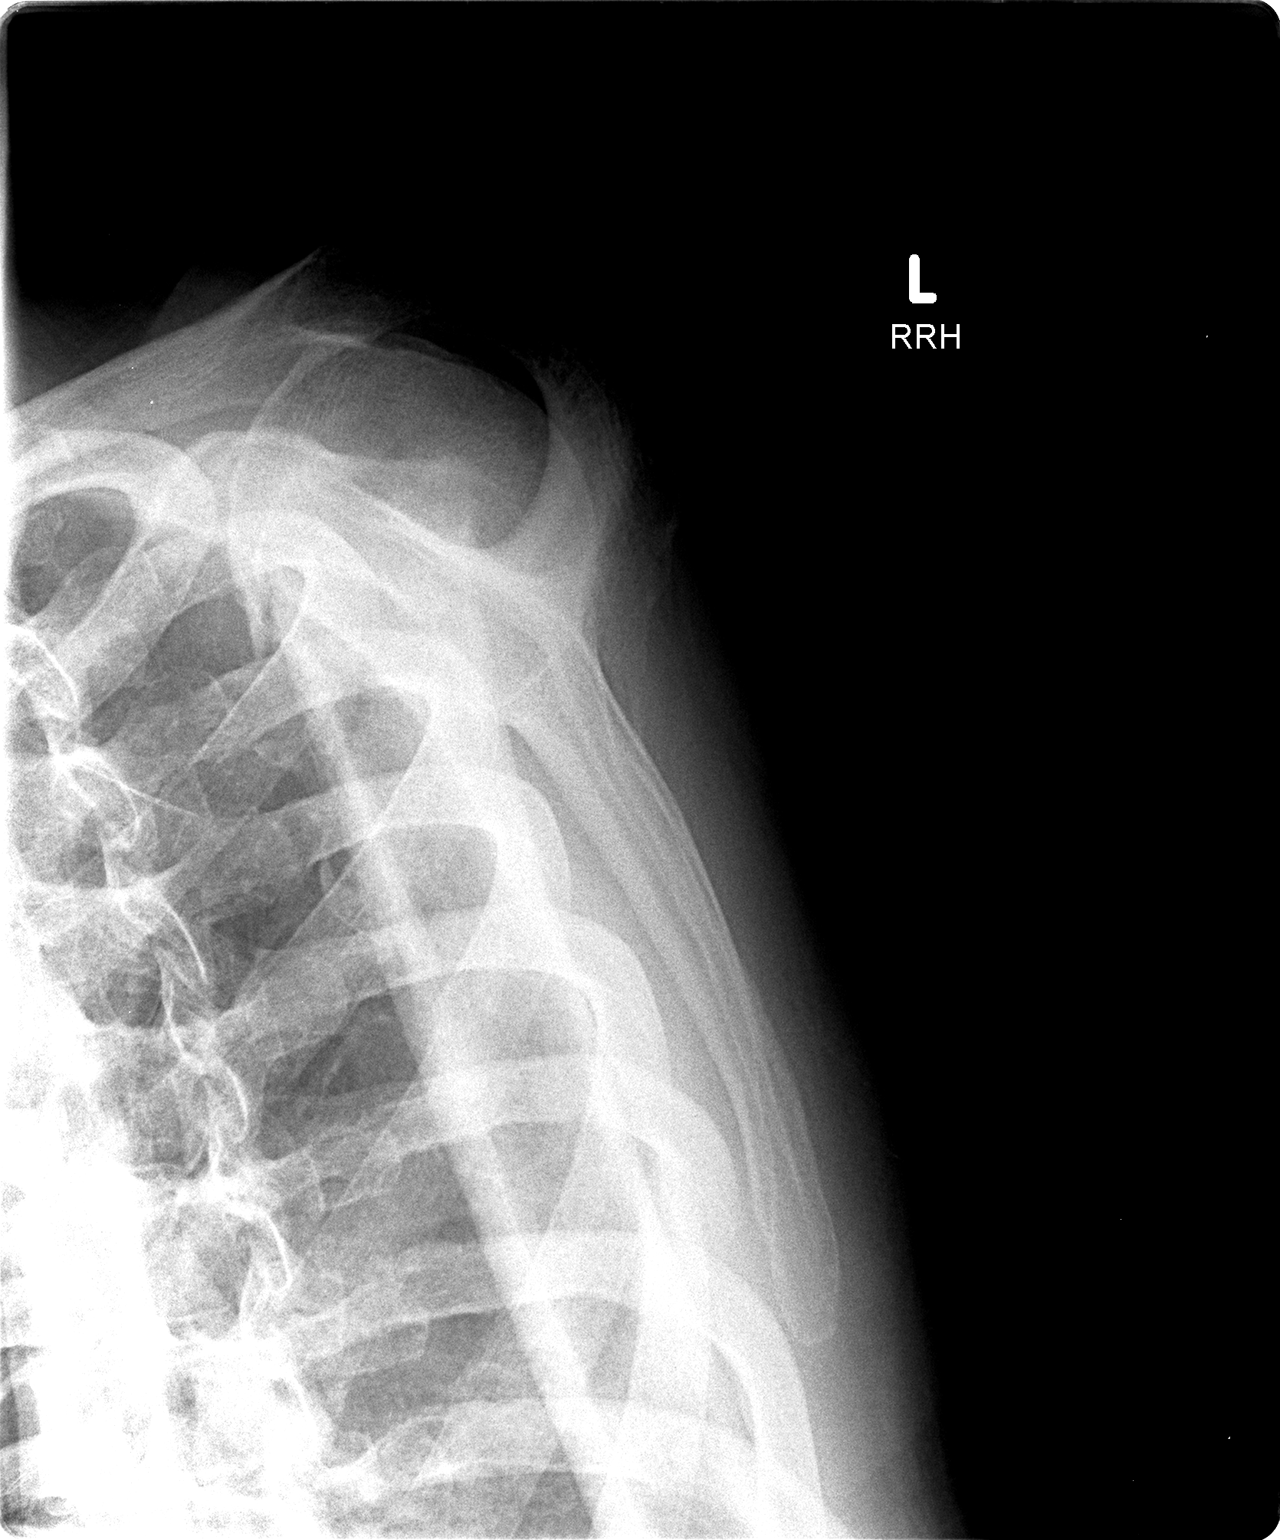

[3 of 3 positions shown; findings below may reference images not displayed]

FINDINGS: A healing comminuted fracture of the left humeral head
and surgical neck is noted.  The humeral head appears located,
although there is slight cephalad subluxation on the Y-view.  The
fracture appears to be healing with some mild medial angulation
(approximately 20 degrees).
IMPRESSION: 1.  Healing comminuted fracture of the left humeral head and neck,
as above.

## 2014-02-03 DIAGNOSIS — D62 Acute posthemorrhagic anemia: Secondary | ICD-10-CM

## 2014-02-03 DIAGNOSIS — I825Z9 Chronic embolism and thrombosis of unspecified deep veins of unspecified distal lower extremity: Secondary | ICD-10-CM

## 2014-02-03 HISTORY — DX: Chronic embolism and thrombosis of unspecified deep veins of unspecified distal lower extremity: I82.5Z9

## 2014-02-03 HISTORY — DX: Acute posthemorrhagic anemia: D62

## 2014-02-12 ENCOUNTER — Encounter (HOSPITAL_COMMUNITY): Payer: Self-pay | Admitting: Emergency Medicine

## 2014-02-12 ENCOUNTER — Inpatient Hospital Stay (HOSPITAL_COMMUNITY): Payer: Medicare Other

## 2014-02-12 ENCOUNTER — Emergency Department (HOSPITAL_COMMUNITY): Payer: Medicare Other

## 2014-02-12 ENCOUNTER — Inpatient Hospital Stay (HOSPITAL_COMMUNITY)
Admission: EM | Admit: 2014-02-12 | Discharge: 2014-02-16 | DRG: 896 | Disposition: A | Discharge status: Skilled Nursing Facility | Payer: Medicare Other | Attending: Internal Medicine | Admitting: Internal Medicine

## 2014-02-12 DIAGNOSIS — I10 Essential (primary) hypertension: Secondary | ICD-10-CM | POA: Diagnosis present

## 2014-02-12 DIAGNOSIS — E871 Hypo-osmolality and hyponatremia: Secondary | ICD-10-CM | POA: Diagnosis present

## 2014-02-12 DIAGNOSIS — E872 Acidosis, unspecified: Secondary | ICD-10-CM | POA: Diagnosis present

## 2014-02-12 DIAGNOSIS — M109 Gout, unspecified: Secondary | ICD-10-CM | POA: Diagnosis present

## 2014-02-12 DIAGNOSIS — D61818 Other pancytopenia: Secondary | ICD-10-CM | POA: Diagnosis present

## 2014-02-12 DIAGNOSIS — F17200 Nicotine dependence, unspecified, uncomplicated: Secondary | ICD-10-CM | POA: Diagnosis present

## 2014-02-12 DIAGNOSIS — E876 Hypokalemia: Secondary | ICD-10-CM | POA: Diagnosis present

## 2014-02-12 DIAGNOSIS — M5124 Other intervertebral disc displacement, thoracic region: Secondary | ICD-10-CM | POA: Diagnosis present

## 2014-02-12 DIAGNOSIS — M25519 Pain in unspecified shoulder: Secondary | ICD-10-CM | POA: Diagnosis present

## 2014-02-12 DIAGNOSIS — G9340 Encephalopathy, unspecified: Secondary | ICD-10-CM | POA: Diagnosis present

## 2014-02-12 DIAGNOSIS — F10939 Alcohol use, unspecified with withdrawal, unspecified: Secondary | ICD-10-CM | POA: Diagnosis present

## 2014-02-12 DIAGNOSIS — G8929 Other chronic pain: Secondary | ICD-10-CM | POA: Diagnosis present

## 2014-02-12 DIAGNOSIS — R29898 Other symptoms and signs involving the musculoskeletal system: Secondary | ICD-10-CM

## 2014-02-12 DIAGNOSIS — W06XXXA Fall from bed, initial encounter: Secondary | ICD-10-CM | POA: Diagnosis present

## 2014-02-12 DIAGNOSIS — E43 Unspecified severe protein-calorie malnutrition: Secondary | ICD-10-CM

## 2014-02-12 DIAGNOSIS — F10930 Alcohol use, unspecified with withdrawal, uncomplicated: Secondary | ICD-10-CM

## 2014-02-12 DIAGNOSIS — E878 Other disorders of electrolyte and fluid balance, not elsewhere classified: Secondary | ICD-10-CM

## 2014-02-12 DIAGNOSIS — E41 Nutritional marasmus: Secondary | ICD-10-CM | POA: Diagnosis present

## 2014-02-12 DIAGNOSIS — Z9181 History of falling: Secondary | ICD-10-CM

## 2014-02-12 DIAGNOSIS — F1010 Alcohol abuse, uncomplicated: Secondary | ICD-10-CM

## 2014-02-12 DIAGNOSIS — F1020 Alcohol dependence, uncomplicated: Secondary | ICD-10-CM | POA: Diagnosis present

## 2014-02-12 DIAGNOSIS — M129 Arthropathy, unspecified: Secondary | ICD-10-CM | POA: Diagnosis present

## 2014-02-12 DIAGNOSIS — IMO0002 Reserved for concepts with insufficient information to code with codable children: Secondary | ICD-10-CM

## 2014-02-12 DIAGNOSIS — F10239 Alcohol dependence with withdrawal, unspecified: Secondary | ICD-10-CM | POA: Diagnosis not present

## 2014-02-12 DIAGNOSIS — F10230 Alcohol dependence with withdrawal, uncomplicated: Secondary | ICD-10-CM

## 2014-02-12 DIAGNOSIS — R5381 Other malaise: Secondary | ICD-10-CM | POA: Diagnosis not present

## 2014-02-12 DIAGNOSIS — Z23 Encounter for immunization: Secondary | ICD-10-CM | POA: Diagnosis not present

## 2014-02-12 DIAGNOSIS — R531 Weakness: Secondary | ICD-10-CM | POA: Diagnosis present

## 2014-02-12 DIAGNOSIS — R262 Difficulty in walking, not elsewhere classified: Secondary | ICD-10-CM

## 2014-02-12 HISTORY — DX: Other injury of unspecified body region, initial encounter: T14.8XXA

## 2014-02-12 LAB — COMPREHENSIVE METABOLIC PANEL
ALK PHOS: 78 U/L (ref 39–117)
ALT: 11 U/L (ref 0–53)
ANION GAP: 20 — AB (ref 5–15)
AST: 50 U/L — ABNORMAL HIGH (ref 0–37)
Albumin: 2.3 g/dL — ABNORMAL LOW (ref 3.5–5.2)
BILIRUBIN TOTAL: 0.9 mg/dL (ref 0.3–1.2)
BUN: 30 mg/dL — AB (ref 6–23)
CHLORIDE: 91 meq/L — AB (ref 96–112)
CO2: 19 meq/L (ref 19–32)
CREATININE: 0.77 mg/dL (ref 0.50–1.35)
Calcium: 9.4 mg/dL (ref 8.4–10.5)
GFR, EST NON AFRICAN AMERICAN: 89 mL/min — AB (ref >90)
GLUCOSE: 156 mg/dL — AB (ref 70–99)
POTASSIUM: 3.6 meq/L — AB (ref 3.7–5.3)
Sodium: 130 mEq/L — ABNORMAL LOW (ref 137–147)
Total Protein: 7.1 g/dL (ref 6.0–8.3)

## 2014-02-12 LAB — CBC WITH DIFFERENTIAL/PLATELET
BASOS PCT: 0 % (ref 0–1)
Basophils Absolute: 0 10*3/uL (ref 0.0–0.1)
Eosinophils Absolute: 0 10*3/uL (ref 0.0–0.7)
Eosinophils Relative: 0 % (ref 0–5)
HEMATOCRIT: 34.1 % — AB (ref 39.0–52.0)
HEMOGLOBIN: 11.6 g/dL — AB (ref 13.0–17.0)
LYMPHS ABS: 0.4 10*3/uL — AB (ref 0.7–4.0)
Lymphocytes Relative: 9 % — ABNORMAL LOW (ref 12–46)
MCH: 30.9 pg (ref 26.0–34.0)
MCHC: 34 g/dL (ref 30.0–36.0)
MCV: 90.9 fL (ref 78.0–100.0)
MONO ABS: 0.1 10*3/uL (ref 0.1–1.0)
MONOS PCT: 3 % (ref 3–12)
NEUTROS ABS: 3.7 10*3/uL (ref 1.7–7.7)
Neutrophils Relative %: 88 % — ABNORMAL HIGH (ref 43–77)
Platelets: 224 10*3/uL (ref 150–400)
RBC: 3.75 MIL/uL — AB (ref 4.22–5.81)
RDW: 18.8 % — ABNORMAL HIGH (ref 11.5–15.5)
WBC: 4.2 10*3/uL (ref 4.0–10.5)

## 2014-02-12 LAB — PROTIME-INR
INR: 1.06 (ref 0.00–1.49)
PROTHROMBIN TIME: 13.8 s (ref 11.6–15.2)

## 2014-02-12 LAB — URINALYSIS, ROUTINE W REFLEX MICROSCOPIC
Glucose, UA: NEGATIVE mg/dL
KETONES UR: 15 mg/dL — AB
LEUKOCYTES UA: NEGATIVE
NITRITE: NEGATIVE
PROTEIN: 100 mg/dL — AB
Specific Gravity, Urine: 1.02 (ref 1.005–1.030)
Urobilinogen, UA: 4 mg/dL — ABNORMAL HIGH (ref 0.0–1.0)
pH: 5.5 (ref 5.0–8.0)

## 2014-02-12 LAB — CK: Total CK: 263 U/L — ABNORMAL HIGH (ref 7–232)

## 2014-02-12 LAB — APTT: APTT: 32 s (ref 24–37)

## 2014-02-12 LAB — URINE MICROSCOPIC-ADD ON

## 2014-02-12 LAB — MAGNESIUM: Magnesium: 1.5 mg/dL (ref 1.5–2.5)

## 2014-02-12 LAB — RAPID URINE DRUG SCREEN, HOSP PERFORMED
AMPHETAMINES: NOT DETECTED
BARBITURATES: NOT DETECTED
Benzodiazepines: NOT DETECTED
Cocaine: NOT DETECTED
Opiates: NOT DETECTED
TETRAHYDROCANNABINOL: NOT DETECTED

## 2014-02-12 LAB — ETHANOL: Alcohol, Ethyl (B): <11 mg/dL (ref 0–11)

## 2014-02-12 MED ORDER — VITAMIN B-1 100 MG PO TABS
100.0000 mg | ORAL_TABLET | Freq: Every day | ORAL | Status: DC
  Administered 2014-02-13 – 2014-02-16 (×4): 100 mg via ORAL
  Filled 2014-02-12 (×4): qty 1

## 2014-02-12 MED ORDER — HEPARIN SODIUM (PORCINE) 5000 UNIT/ML IJ SOLN
5000.0000 [IU] | Freq: Three times a day (TID) | INTRAMUSCULAR | Status: DC
  Administered 2014-02-12 – 2014-02-13 (×2): 5000 [IU] via SUBCUTANEOUS
  Filled 2014-02-12 (×2): qty 1

## 2014-02-12 MED ORDER — SODIUM CHLORIDE 0.9 % IV SOLN
1000.0000 mL | INTRAVENOUS | Status: DC
  Administered 2014-02-12 – 2014-02-13 (×4): 1000 mL via INTRAVENOUS

## 2014-02-12 MED ORDER — SODIUM CHLORIDE 0.9 % IV BOLUS (SEPSIS)
1000.0000 mL | Freq: Once | INTRAVENOUS | Status: AC
  Administered 2014-02-12: 1000 mL via INTRAVENOUS

## 2014-02-12 MED ORDER — LORAZEPAM 2 MG/ML IJ SOLN: 0.0000 mg | Freq: Four times a day (QID) | INTRAMUSCULAR | Status: DC

## 2014-02-12 MED ORDER — THIAMINE HCL 100 MG/ML IJ SOLN
100.0000 mg | Freq: Every day | INTRAMUSCULAR | Status: DC
  Filled 2014-02-12: qty 2

## 2014-02-12 MED ORDER — LORAZEPAM 2 MG/ML IJ SOLN: 0.0000 mg | Freq: Two times a day (BID) | INTRAMUSCULAR | Status: DC

## 2014-02-12 MED ORDER — SODIUM CHLORIDE 0.9 % IV SOLN
1000.0000 mL | Freq: Once | INTRAVENOUS | Status: AC
  Administered 2014-02-12: 1000 mL via INTRAVENOUS

## 2014-02-12 MED ORDER — SODIUM CHLORIDE 0.9 % IV SOLN: INTRAVENOUS | Status: DC

## 2014-02-12 MED ORDER — PNEUMOCOCCAL VAC POLYVALENT 25 MCG/0.5ML IJ INJ
0.5000 mL | INJECTION | INTRAMUSCULAR | Status: AC
  Administered 2014-02-15: 0.5 mL via INTRAMUSCULAR
  Filled 2014-02-12: qty 0.5

## 2014-02-12 MED ORDER — LORAZEPAM 2 MG/ML IJ SOLN
0.0000 mg | Freq: Four times a day (QID) | INTRAMUSCULAR | Status: DC
  Administered 2014-02-12: 1 mg via INTRAVENOUS
  Filled 2014-02-12: qty 1

## 2014-02-12 MED ORDER — ONDANSETRON HCL 4 MG PO TABS: 4.0000 mg | ORAL_TABLET | Freq: Four times a day (QID) | ORAL | Status: DC | PRN

## 2014-02-12 MED ORDER — ONDANSETRON HCL 4 MG/2ML IJ SOLN: 4.0000 mg | Freq: Four times a day (QID) | INTRAMUSCULAR | Status: DC | PRN

## 2014-02-12 MED ORDER — LORAZEPAM 2 MG/ML IJ SOLN
1.0000 mg | Freq: Once | INTRAMUSCULAR | Status: AC
  Administered 2014-02-12: 1 mg via INTRAVENOUS
  Filled 2014-02-12: qty 1

## 2014-02-12 MED ORDER — LORAZEPAM 1 MG PO TABS: 0.0000 mg | ORAL_TABLET | Freq: Two times a day (BID) | ORAL | Status: DC

## 2014-02-12 MED ORDER — LORAZEPAM 1 MG PO TABS
0.0000 mg | ORAL_TABLET | Freq: Four times a day (QID) | ORAL | Status: DC
  Filled 2014-02-12: qty 1

## 2014-02-12 MED ORDER — HEPARIN SODIUM (PORCINE) 5000 UNIT/ML IJ SOLN: 5000.0000 [IU] | Freq: Three times a day (TID) | INTRAMUSCULAR | Status: DC

## 2014-02-12 MED ORDER — LORAZEPAM 1 MG PO TABS
0.0000 mg | ORAL_TABLET | Freq: Two times a day (BID) | ORAL | Status: DC
  Administered 2014-02-14 – 2014-02-16 (×3): 1 mg via ORAL
  Filled 2014-02-12 (×3): qty 1

## 2014-02-12 NOTE — ED Notes (Signed)
Report called to Sempra EnergyJanet RN, pt. To  go to floor once MRI completed, Darl PikesSusan in MRI will call us when pt. Is ready to go to floor.

## 2014-02-12 NOTE — ED Notes (Signed)
Pt. Has (3) $100 dollar bills, a green check book with license, SS card, and [place] card in brown envelope with pt. Label, pt. Is in MRI and unable to sign paperwork for property to locked up with security, items will be transported to floor with pt. For belongings to be locked up upon arrival to floor. Items verified by myself and Miquel DunnJudy Young RN.

## 2014-02-12 NOTE — ED Notes (Signed)
Pt in MRI.

## 2014-02-12 NOTE — ED Notes (Signed)
Fell 2 days ago.  C/o pain all over.  Sores to feet. Recently discharged from [facility] from a previous fall and break of shoulder/arm.  Admits to drinking alcohol last night.

## 2014-02-12 NOTE — H&P (Signed)
[facility] History and Physical  Bates [place]lington WUJ:[id number] DOB: 1941/01/05 DOA: 02/12/2014  Referring physician: ER. PCP: Catalina Pizza, MD   Chief Complaint: Bilateral leg weakness.  HPI: Jacob Riddle is a 73 y.o. male  This is a 73 year old man who has a history of alcoholism and he presents to the emergency room after he had some sort of fall couple of days ago. He stayed on the floor for over 20 hours before he was able to get up. Apparently he has had difficulty with walking for the last 3 months as he loses his balance and feels weak. He apparently has not had alcohol for the last 2 days and evaluation in the emergency room was suggestive of alcohol withdrawal. He is now being admitted for further management. He lives in a motel.   Review of Systems:  Unable to give me a clear history of the present time.  Past Medical History  Diagnosis Date  . Arthritis   . Gout   . Hypertension   . Fracture     left arm   History reviewed. No pertinent past surgical history. Social History:  reports that he has been smoking.  He does not have any smokeless tobacco history on file. He reports that he drinks alcohol. He reports that he does not use illicit drugs.  Allergies  Allergen Reactions  . Lisinopril Other (See Comments)    Dizziness    History reviewed. No pertinent family history.   Prior to Admission medications   Not on File   Physical Exam: Filed Vitals:   02/12/14 1600  BP: 186/74  Pulse: 81  Temp:   Resp: 25    BP 186/74  Pulse 81  Temp(Src) 97.5 F (36.4 C) (Oral)  Resp 25  Ht 6' (1.829 m)  Wt 77.111 kg (170 lb)  BMI 23.05 kg/m2  SpO2 99%  General:  Appears calm and comfortable Eyes: PERRL, normal lids, irises & conjunctiva ENT: grossly normal hearing, lips & tongue Neck: no LAD, masses or thyromegaly Cardiovascular: RRR, no m/r/g. No LE edema. Telemetry: SR, no arrhythmias  Respiratory: CTA bilaterally, no w/r/r. Normal respiratory  effort. Abdomen: soft, ntnd Skin: no rash or induration seen on limited exam Musculoskeletal: grossly normal tone BUE/BLE Psychiatric: grossly normal mood and affect, speech fluent and appropriate Neurologic: Unable to lift both his legs up against gravity and concerned there may be some pathological issue with the spinal cord.           Labs on Admission:  Basic Metabolic Panel:  Recent Labs Lab 02/12/14 1303 02/12/14 1344  NA 130*  --   K 3.6*  --   CL 91*  --   CO2 19  --   GLUCOSE 156*  --   BUN 30*  --   CREATININE 0.77  --   CALCIUM 9.4  --   MG  --  1.5   Liver Function Tests:  Recent Labs Lab 02/12/14 1303  AST 50*  ALT 11  ALKPHOS 78  BILITOT 0.9  PROT 7.1  ALBUMIN 2.3*   No results found for this basename: LIPASE, AMYLASE,  in the last 168 hours No results found for this basename: AMMONIA,  in the last 168 hours CBC:  Recent Labs Lab 02/12/14 1303  WBC 4.2  NEUTROABS 3.7  HGB 11.6*  HCT 34.1*  MCV 90.9  PLT 224   Cardiac Enzymes:  Recent Labs Lab 02/12/14 1344  CKTOTAL 263*    BNP (last 3 results) No  results found for this basename: PROBNP,  in the last 8760 hours CBG: No results found for this basename: GLUCAP,  in the last 168 hours  Radiological Exams on Admission: Dg Lumbar Spine Complete  02/12/2014   CLINICAL DATA:  Low back pain after falling out of bed 2 days ago.  EXAM: LUMBAR SPINE - COMPLETE 4+ VIEW  COMPARISON:  None.  FINDINGS: There is no fracture. Mild convex right scoliosis is noted. The patient has advanced multilevel facet and degenerative disc disease. 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  IMPRESSION: No acute finding.  Scoliosis and marked multilevel degenerative change.  Atherosclerosis.   Electronically Signed   By: Drusilla Kanner M.D.   On: 02/12/2014 14:28   Ct Head Wo Contrast  02/12/2014   CLINICAL DATA:  Fall.  Weakness.  EXAM: CT HEAD WITHOUT CONTRAST  TECHNIQUE: Contiguous axial images were  obtained from the base of the skull through the vertex without intravenous contrast.  COMPARISON:  CT head without contrast 12/29/2003  FINDINGS: There is significant interval progression of diffuse atrophy and white matter disease. 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 No acute cortical infarct, hemorrhage, or mass lesion is present. Ventricular enlargement is proportionate to the degree of atrophy.  The paranasal sinuses and mastoid air cells are clear. The osseous skull is intact. Mild soft tissue swelling is noted above the right orbit. There is no underlying fracture.  IMPRESSION: 1. Significant interval progression of generalized atrophy and white matter disease. This is compatible with progressive chronic microvascular ischemia. 2. 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 3. No acute intracranial abnormality. 4. Question soft swelling in the right supraorbital scalp without an underlying fracture.   Electronically Signed   By: Gennette Pac M.D.   On: 02/12/2014 14:37   Dg Chest Port 1 View  02/12/2014   CLINICAL DATA:  Fall, cough, weakness  EXAM: PORTABLE CHEST - 1 VIEW  COMPARISON:  Portal chest radiograph 09/11/2011  FINDINGS: The heart size and mediastinal contours are within normal limits. Atherosclerotic calcifications in the aorta. Both lungs are clear. The visualized skeletal structures are unremarkable.  IMPRESSION: No active disease.   Electronically Signed   By: Salome Holmes M.D.   On: 02/12/2014 13:25   Dg Shoulder Left  02/12/2014   CLINICAL DATA:  Left shoulder pain status post fall 2 days ago.  EXAM: LEFT SHOULDER - 2+ VIEW  COMPARISON:  Left shoulder series of October 25, 2011  FINDINGS: The patient had sustained a comminuted angulated subcapital fracture of the left shoulder in February of 2013. The fracture has healed with residual deformity. No acute fracture is demonstrated. There is minimal degenerative change of the glenohumeral  and of the AC joints. The left clavicle and observed left ribs are normal. There is old deformity of the posterior lateral aspect of the left seventh rib.  IMPRESSION: There is no acute fracture of the left shoulder. There is chronic deformity related to the patient's previous fracture.   Electronically Signed   By: David  [place]   On: 02/12/2014 15:28    EKG: Independently reviewed. Normal sinus rhythm without any acute ST-T wave changes.  Assessment/Plan   1. Bilateral leg weakness. This may be related to generalized weakness and his history of alcoholism but I'm somewhat concerned clinically that he may have a spinal cord problem causing the bilateral leg weakness. Brain scan is unremarkable for any acute stroke or lesion. 2. Alcohol withdrawal. 3. Alcoholism.  Plan: 1. Admit to medical floor. 2. MRI of the thoracic and lumbar spine. 3. Alcohol withdrawal protocol. 4. IV fluids.  Further recommendations will depend on patient's hospital progress.  Code Status: Full code.  Disposition Plan: Depending on progress.   Time spent: 60 minutes.  Wilson SingerGOSRANI,Cherlynn Popiel C [facility] Pager [phone].  **Disclaimer: This note may have been dictated with voice recognition software. Similar sounding words can inadvertently be transcribed and this note may contain transcription errors which may not have been corrected upon publication of note.**

## 2014-02-12 NOTE — ED Notes (Signed)
Dr. Karilyn CotaGosrani in to see pt.

## 2014-02-12 NOTE — ED Notes (Signed)
Called Marylu LundJanet RN and advised of money, etc sealed in enevelope, items will be sent with pt. To floor and then locked up by security. Marylu LundJanet RN agreed.

## 2014-02-12 NOTE — ED Provider Notes (Signed)
CSN: [id number]     Arrival date & time 02/12/14  1226 History  This chart was scribed for Ward Givens, MD by Leone Payor, ED Scribe. This patient was seen in room APA19/APA19 and the patient's care was started 1:23 PM.    Chief Complaint  Patient presents with  . Fall    The history is provided by the patient. No language interpreter was used.    HPI Comments: Jacob Riddle is a 73 y.o. male who presents to the Emergency Department complaining of a fall that occurred 2 days ago. Patient states he was sleeping when he fell out of bed. He states he stayed on the floor for ~20 hours before he was able to get up when a friend came by to visit. He denies head injury or LOC. He complains of constant lower back pain and left shoulder pain. He has not consumed any food or liquids since the fall. He denies nausea, vomiting, diarrhea. He reports having inability to walk for the past 3 months because he loses his balance and feels weak. He reports a history of falls due to his inability to walk. He states he has a cane to assist him. He also has discoloration to the bilateral feet that he states has been there for years.  He is a 1 ppd smoker and a daily alcohol user. He has not had any alcohol for the past 2 days and reports feeling tremulous. He denies history of seizures with alcohol withdrawal. He denies feeling that he has something crawling on him or seeing things crawling on the wall.    Patient states he was released from the [facility] a few years ago and has been living in a motel since. His PCP used to be Dr. Catalina Pizza but he has not seen him in about 2 years. He also has not taken any medications for about 2 years.   PCP none  Past Medical History  Diagnosis Date  . Arthritis   . Gout   . Hypertension   . Fracture     left arm   History reviewed. No pertinent past surgical history. History reviewed. No pertinent family history. History  Substance Use Topics  . Smoking  status: Current Every Day Smoker  . Smokeless tobacco: Not on file  . Alcohol Use: Yes     Comment: 1/5 liqour/week   smokes one pack per day Drinks 2-3 drinks a day Lives alone Uses a cane  Review of Systems  Gastrointestinal: Negative for vomiting and diarrhea.  Musculoskeletal: Positive for arthralgias and back pain.  Neurological: Positive for tremors.  All other systems reviewed and are negative.     Allergies  Lisinopril  Home Medications   Prior to Admission medications   Not on File  None in 2 years    BP 180/73  Pulse 90  Temp(Src) 97.5 F (36.4 C) (Oral)  Resp 18  Ht 6' (1.829 m)  Wt 170 lb (77.111 kg)  BMI 23.05 kg/m2  SpO2 99%  Vital signs normal except hypertension  Physical Exam  Nursing note and vitals reviewed. Constitutional: He is oriented to person, place, and time.  Non-toxic appearance. He has a sickly appearance. He appears ill. No distress.  Ill-kept, poor hygiene.   HENT:  Head: Normocephalic and atraumatic.  Right Ear: External ear normal.  Left Ear: External ear normal.  Nose: Nose normal. No mucosal edema or rhinorrhea.  Mouth/Throat: Oropharynx is clear and moist and mucous membranes are  normal. No dental abscesses or uvula swelling.  Tremor of the tongue.   Eyes: Conjunctivae and EOM are normal. Pupils are equal, round, and reactive to light.  Neck: Normal range of motion and full passive range of motion without pain. Neck supple.  Cardiovascular: Normal rate, regular rhythm and normal heart sounds.  Exam reveals no gallop and no friction rub.   No murmur heard. Pulmonary/Chest: Effort normal and breath sounds normal. No respiratory distress. He has no wheezes. He has no rhonchi. He has no rales. He exhibits no tenderness and no crepitus.  Abdominal: Soft. Normal appearance and bowel sounds are normal. He exhibits no distension. There is no tenderness. There is no rebound and no guarding.  Musculoskeletal: He exhibits edema and  tenderness.  Non tender thoracic spine. Lumbar spine non tender until reaching the sacral area.   Mild edema of lower legs. Reddish discoloration around MTP joints bilaterally. See photo.   Neurological: He is alert and oriented to person, place, and time. He has normal strength. He displays tremor. No cranial nerve deficit.  Skin: Skin is warm, dry and intact. No rash noted. No erythema. No pallor.  Very thickened, flaky skin.   Psychiatric: His mood appears not anxious. His speech is delayed. He is slowed.  Mild tremor       ED Course  Procedures (including critical care time)  Medications  0.9 %  sodium chloride infusion (0 mLs Intravenous Stopped 02/12/14 1510)    Followed by  0.9 %  sodium chloride infusion (1,000 mLs Intravenous New Bag/Given 02/12/14 1610)  LORazepam (ATIVAN) injection 1 mg (1 mg Intravenous Given 02/12/14 1350)  sodium chloride 0.9 % bolus 1,000 mL (0 mLs Intravenous Stopped 02/12/14 1608)     DIAGNOSTIC STUDIES: Oxygen Saturation is 99% on RA, normal by my interpretation.    COORDINATION OF CARE: 1:33 PM Discussed treatment plan with pt at bedside and pt agreed to plan.  Patient given IV fluids for his apparent dehydration. He also was given Ativan because he appears to be in early alcohol withdrawal with tachycardia, hypertension and tremor.  A friend was here. States the patient has only been out of his apartment once in the past couple of months when the friend took him to the bank. States the patient had fallen 5 days ago and 2 days ago and he had to help get him up off the floor. States another friend was bringing his alcohol, which he has told him to stop.   16:21 Dr Karilyn CotaGosrani, admit to med-surg, team 2 for alcohol withdrawal.    Labs Review Results for orders placed during the hospital encounter of 02/12/14  CBC WITH DIFFERENTIAL      Result Value Ref Range   WBC 4.2  4.0 - 10.5 K/uL   RBC 3.75 (*) 4.22 - 5.81 MIL/uL   Hemoglobin 11.6 (*) 13.0 -  17.0 g/dL   HCT 82.934.1 (*) 56.239.0 - 13.052.0 %   MCV 90.9  78.0 - 100.0 fL   MCH 30.9  26.0 - 34.0 pg   MCHC 34.0  30.0 - 36.0 g/dL   RDW 86.518.8 (*) 78.411.5 - 69.615.5 %   Platelets 224  150 - 400 K/uL   Neutrophils Relative % 88 (*) 43 - 77 %   Neutro Abs 3.7  1.7 - 7.7 K/uL   Lymphocytes Relative 9 (*) 12 - 46 %   Lymphs Abs 0.4 (*) 0.7 - 4.0 K/uL   Monocytes Relative 3  3 - 12 %  Monocytes Absolute 0.1  0.1 - 1.0 K/uL   Eosinophils Relative 0  0 - 5 %   Eosinophils Absolute 0.0  0.0 - 0.7 K/uL   Basophils Relative 0  0 - 1 %   Basophils Absolute 0.0  0.0 - 0.1 K/uL  COMPREHENSIVE METABOLIC PANEL      Result Value Ref Range   Sodium 130 (*) 137 - 147 mEq/L   Potassium 3.6 (*) 3.7 - 5.3 mEq/L   Chloride 91 (*) 96 - 112 mEq/L   CO2 19  19 - 32 mEq/L   Glucose, Bld 156 (*) 70 - 99 mg/dL   BUN 30 (*) 6 - 23 mg/dL   Creatinine, Ser 9.60  0.50 - 1.35 mg/dL   Calcium 9.4  8.4 - 45.4 mg/dL   Total Protein 7.1  6.0 - 8.3 g/dL   Albumin 2.3 (*) 3.5 - 5.2 g/dL   AST 50 (*) 0 - 37 U/L   ALT 11  0 - 53 U/L   Alkaline Phosphatase 78  39 - 117 U/L   Total Bilirubin 0.9  0.3 - 1.2 mg/dL   GFR calc non Af Amer 89 (*) >90 mL/min   GFR calc Af Amer >90  >90 mL/min   Anion gap 20 (*) 5 - 15  URINALYSIS, ROUTINE W REFLEX MICROSCOPIC      Result Value Ref Range   Color, Urine YELLOW  YELLOW   APPearance CLEAR  CLEAR   Specific Gravity, Urine 1.020  1.005 - 1.030   pH 5.5  5.0 - 8.0   Glucose, UA NEGATIVE  NEGATIVE mg/dL   Hgb urine dipstick SMALL (*) NEGATIVE   Bilirubin Urine SMALL (*) NEGATIVE   Ketones, ur 15 (*) NEGATIVE mg/dL   Protein, ur 098 (*) NEGATIVE mg/dL   Urobilinogen, UA 4.0 (*) 0.0 - 1.0 mg/dL   Nitrite NEGATIVE  NEGATIVE   Leukocytes, UA NEGATIVE  NEGATIVE  ETHANOL      Result Value Ref Range   Alcohol, Ethyl (B) <11  0 - 11 mg/dL  CK      Result Value Ref Range   Total CK 263 (*) 7 - 232 U/L  URINE RAPID DRUG SCREEN (HOSP PERFORMED)      Result Value Ref Range   Opiates NONE  DETECTED  NONE DETECTED   Cocaine NONE DETECTED  NONE DETECTED   Benzodiazepines NONE DETECTED  NONE DETECTED   Amphetamines NONE DETECTED  NONE DETECTED   Tetrahydrocannabinol NONE DETECTED  NONE DETECTED   Barbiturates NONE DETECTED  NONE DETECTED  MAGNESIUM      Result Value Ref Range   Magnesium 1.5  1.5 - 2.5 mg/dL  APTT      Result Value Ref Range   aPTT 32  24 - 37 seconds  PROTIME-INR      Result Value Ref Range   Prothrombin Time 13.8  11.6 - 15.2 seconds   INR 1.06  0.00 - 1.49  URINE MICROSCOPIC-ADD ON      Result Value Ref Range   Squamous Epithelial / LPF RARE  RARE   WBC, UA 0-2  <3 WBC/hpf   RBC / HPF 3-6  <3 RBC/hpf   Bacteria, UA FEW (*) RARE    Laboratory interpretation all normal except hyponatremia, low chloride, improving anemia, minor elevation of LFTs, no evidence of rhabdomyolysis or UTI    Imaging Review Dg Lumbar Spine Complete  02/12/2014   CLINICAL DATA:  Low back pain after falling out of bed  2 days ago.  EXAM: LUMBAR SPINE - COMPLETE 4+ VIEW  COMPARISON:  None.  FINDINGS: There is no fracture. Mild convex right scoliosis is noted. The patient has advanced multilevel facet and degenerative disc disease. 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  IMPRESSION: No acute finding.  Scoliosis and marked multilevel degenerative change.  Atherosclerosis.   Electronically Signed   By: Drusilla Kanner M.D.   On: 02/12/2014 14:28   Ct Head Wo Contrast  02/12/2014   CLINICAL DATA:  Fall.  Weakness.  EXAM: CT HEAD WITHOUT CONTRAST  TECHNIQUE: Contiguous axial images were obtained from the base of the skull through the vertex without intravenous contrast.  COMPARISON:  CT head without contrast 12/29/2003  FINDINGS: There is significant interval progression of diffuse atrophy and white matter disease. 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 No acute cortical infarct, hemorrhage, or mass lesion is present. Ventricular enlargement is  proportionate to the degree of atrophy.  The paranasal sinuses and mastoid air cells are clear. The osseous skull is intact. Mild soft tissue swelling is noted above the right orbit. There is no underlying fracture.  IMPRESSION: 1. Significant interval progression of generalized atrophy and white matter disease. This is compatible with progressive chronic microvascular ischemia. 2. 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 3. No acute intracranial abnormality. 4. Question soft swelling in the right supraorbital scalp without an underlying fracture.   Electronically Signed   By: Gennette Pac M.D.   On: 02/12/2014 14:37   Dg Chest Port 1 View  02/12/2014   CLINICAL DATA:  Fall, cough, weakness  EXAM: PORTABLE CHEST - 1 VIEW  COMPARISON:  Portal chest radiograph 09/11/2011  FINDINGS: The heart size and mediastinal contours are within normal limits. Atherosclerotic calcifications in the aorta. Both lungs are clear. The visualized skeletal structures are unremarkable.  IMPRESSION: No active disease.   Electronically Signed   By: Salome Holmes M.D.   On: 02/12/2014 13:25   Dg Shoulder Left  02/12/2014   CLINICAL DATA:  Left shoulder pain status post fall 2 days ago.  EXAM: LEFT SHOULDER - 2+ VIEW  COMPARISON:  Left shoulder series of October 25, 2011  FINDINGS: The patient had sustained a comminuted angulated subcapital fracture of the left shoulder in February of 2013. The fracture has healed with residual deformity. No acute fracture is demonstrated. There is minimal degenerative change of the glenohumeral and of the AC joints. The left clavicle and observed left ribs are normal. There is old deformity of the posterior lateral aspect of the left seventh rib.  IMPRESSION: There is no acute fracture of the left shoulder. There is chronic deformity related to the patient's previous fracture.   Electronically Signed   By: David  [place]   On: 02/12/2014 15:28     EKG  Interpretation   Date/Time:  Friday February 12 2014 13:08:12 EDT Ventricular Rate:  113 PR Interval:    QRS Duration: 95 QT Interval:  356 QTC Calculation: 488 R Axis:   84 Text Interpretation:  Sinus tachycardia Artifact Probable lateral infarct,  age indeterminate No significant change since last tracing 08 Sep 2011  Confirmed by [facility]  MD-I, Keron Neenan ([id number]) on 02/12/2014 2:16:59 PM      MDM   Final diagnoses:  Alcohol withdrawal, uncomplicated  Weakness generalized  Inability to walk  Hyponatremia  Serum chloride decreased    Plan admission   Devoria Albe, MD, FACEP    I personally performed the  services described in this documentation, which was scribed in my presence. The recorded information has been reviewed and considered.  Devoria Albe, MD, Armando Gang   Ward Givens, MD 02/12/14 [phone]

## 2014-02-13 DIAGNOSIS — D61818 Other pancytopenia: Secondary | ICD-10-CM

## 2014-02-13 DIAGNOSIS — F17200 Nicotine dependence, unspecified, uncomplicated: Secondary | ICD-10-CM

## 2014-02-13 DIAGNOSIS — F1020 Alcohol dependence, uncomplicated: Secondary | ICD-10-CM

## 2014-02-13 LAB — COMPREHENSIVE METABOLIC PANEL
ALT: 10 U/L (ref 0–53)
AST: 46 U/L — AB (ref 0–37)
Albumin: 1.8 g/dL — ABNORMAL LOW (ref 3.5–5.2)
Alkaline Phosphatase: 64 U/L (ref 39–117)
Anion gap: 16 — ABNORMAL HIGH (ref 5–15)
BUN: 19 mg/dL (ref 6–23)
CALCIUM: 8.2 mg/dL — AB (ref 8.4–10.5)
CO2: 19 mEq/L (ref 19–32)
CREATININE: 0.65 mg/dL (ref 0.50–1.35)
Chloride: 99 mEq/L (ref 96–112)
GFR calc Af Amer: >90 mL/min (ref >90)
GFR calc non Af Amer: >90 mL/min (ref >90)
Glucose, Bld: 110 mg/dL — ABNORMAL HIGH (ref 70–99)
Potassium: 2.9 mEq/L — CL (ref 3.7–5.3)
Sodium: 134 mEq/L — ABNORMAL LOW (ref 137–147)
TOTAL PROTEIN: 5.8 g/dL — AB (ref 6.0–8.3)
Total Bilirubin: 0.6 mg/dL (ref 0.3–1.2)

## 2014-02-13 LAB — CBC
HCT: 27.1 % — ABNORMAL LOW (ref 39.0–52.0)
HEMOGLOBIN: 9.1 g/dL — AB (ref 13.0–17.0)
MCH: 30.4 pg (ref 26.0–34.0)
MCHC: 33.6 g/dL (ref 30.0–36.0)
MCV: 90.6 fL (ref 78.0–100.0)
Platelets: 137 10*3/uL — ABNORMAL LOW (ref 150–400)
RBC: 2.99 MIL/uL — AB (ref 4.22–5.81)
RDW: 18.9 % — ABNORMAL HIGH (ref 11.5–15.5)
WBC: 2.5 10*3/uL — AB (ref 4.0–10.5)

## 2014-02-13 LAB — MAGNESIUM: Magnesium: 1.3 mg/dL — ABNORMAL LOW (ref 1.5–2.5)

## 2014-02-13 MED ORDER — ACETAMINOPHEN 325 MG PO TABS: 650.0000 mg | ORAL_TABLET | Freq: Four times a day (QID) | ORAL | Status: DC | PRN

## 2014-02-13 MED ORDER — POTASSIUM CHLORIDE CRYS ER 20 MEQ PO TBCR
40.0000 meq | EXTENDED_RELEASE_TABLET | Freq: Three times a day (TID) | ORAL | Status: AC
  Administered 2014-02-13 (×3): 40 meq via ORAL
  Filled 2014-02-13 (×3): qty 2

## 2014-02-13 MED ORDER — LORAZEPAM 1 MG PO TABS: 0.0000 mg | ORAL_TABLET | Freq: Four times a day (QID) | ORAL | Status: DC

## 2014-02-13 MED ORDER — LORAZEPAM 2 MG/ML IJ SOLN: 0.0000 mg | Freq: Four times a day (QID) | INTRAMUSCULAR | Status: DC

## 2014-02-13 MED ORDER — LORAZEPAM 1 MG PO TABS: 0.0000 mg | ORAL_TABLET | Freq: Four times a day (QID) | ORAL | Status: AC

## 2014-02-13 MED ORDER — HYDROCODONE-ACETAMINOPHEN 5-325 MG PO TABS
1.0000 | ORAL_TABLET | Freq: Four times a day (QID) | ORAL | Status: DC | PRN
  Administered 2014-02-13 – 2014-02-16 (×6): 1 via ORAL
  Filled 2014-02-13 (×6): qty 1

## 2014-02-13 MED ORDER — HEPARIN SODIUM (PORCINE) 5000 UNIT/ML IJ SOLN
5000.0000 [IU] | Freq: Three times a day (TID) | INTRAMUSCULAR | Status: DC
  Administered 2014-02-13 – 2014-02-16 (×8): 5000 [IU] via SUBCUTANEOUS
  Filled 2014-02-13 (×9): qty 1

## 2014-02-13 MED ORDER — MAGNESIUM SULFATE 40 MG/ML IJ SOLN
2.0000 g | Freq: Once | INTRAMUSCULAR | Status: AC
Start: 2014-02-13 — End: 2014-02-13
  Administered 2014-02-13: 2 g via INTRAVENOUS
  Filled 2014-02-13: qty 50

## 2014-02-13 MED ORDER — LORAZEPAM 2 MG/ML IJ SOLN
0.0000 mg | Freq: Four times a day (QID) | INTRAMUSCULAR | Status: AC
  Administered 2014-02-13: 4 mg via INTRAVENOUS
  Administered 2014-02-13 (×2): 2 mg via INTRAVENOUS
  Administered 2014-02-14: 4 mg via INTRAVENOUS
  Filled 2014-02-13 (×2): qty 2
  Filled 2014-02-13 (×3): qty 1

## 2014-02-13 NOTE — Progress Notes (Signed)
Utilization review Completed Kaho Selle RN BSN   

## 2014-02-13 NOTE — Progress Notes (Signed)
PROGRESS NOTE  Rhona LeavensJohn Charles ZOX:[id number] DOB: 1941/07/07 DOA: 02/12/2014 PCP: Catalina PizzaHALL, ZACH, MD has not seen in 2 years. Not on any medications.  Summary: 73 year old man with history of alcohol dependence, recurrent falls and gait ataxia for the last 3 months presented with a history of having fallen, laying on the floor for approximately 20 hours. Continues to drink alcohol daily. Admitted for alcohol withdrawal. Admitting physician neurologic exam "Unable to lift both his legs up against gravity and concerned there may be some pathological issue with the spinal cord"  Assessment/Plan: 1. Suspected alcohol withdrawal with possible acute encephalopathy. Ongoing, but appears stable on CIWA. 2. Bilateral lower extremity weakness. Able to lift both legs and move arms. Globally weak. No paralysis or paraesthesias. MRI findings as below. 3. 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 Suspect chronic. Discussed with neurosurgery as below. No further evaluation. 4. Gait ataxia for 3 months secondary to imbalance and generalized weakness. History of falls. Uses cane at home by report. 5. Pancytopenia suspect secondary to alcohol abuse. Normocytic anemia noted. Pancytopenia appears to be back to February 2013. Likely is stable 6. Hypokalemia, hypomagnesemia. Likely secondary to alcohol dependence, poor solute intake. 7. Tobacco dependence. 8. Daily alcohol use, suspected alcohol dependence. 9. Poor social situation. Lives in a motel. No PCP or current outpatient medications.   Continue CIWA. No evidence of obvious sequela status post fall. No evidence of abnormalities. Kidney function preserved.  Discussed MRI findings and exam with neurosurgery Dr. Newell CoralNudelman, his opinion MRI findings insignificant do not need further evaluation.  Replete potassium, magnesium.  BMP, CBC in the morning  Social work consult for substance abuse ongoing as well as poor social  situation  No family present. Discussed with RN plan, notify physician when friends/family present.  Code Status: full code DVT prophylaxis: SCDs, heparin Family Communication: none present  Disposition Plan: pending PT eval  Brendia Sacksaniel Keerthana Vanrossum, MD  [facility]  Pager [phone] If 7PM-7AM, please contact night-coverage at [email or web address], password [email or web address] 02/13/2014, 9:12 AM  LOS: 1 day   Consultants:    Procedures:    Antibiotics:    HPI/Subjective: 2 mg of Ativan at 2 AM this morning. Total 4 mg since admission.  No issues overnight per RN.  Patient reports generalized weakness, chronic left shoulder pain, no paresthesias. Some difficulty urinating at home.  Objective: Filed Vitals:   02/13/14 0011 02/13/14 0154 02/13/14 0300 02/13/14 0551  BP: 156/72 167/83  145/45  Pulse: 98 107  99  Temp: 97.8 F (36.6 C) 98.3 F (36.8 C)  98.5 F (36.9 C)  TempSrc: Oral Oral  Oral  Resp: 20 22  20   Height:   6' (1.829 m)   Weight:   74.5 kg (164 lb 3.9 oz)   SpO2: 99% 100%  100%    Intake/Output Summary (Last 24 hours) at 02/13/14 0912 Last data filed at 02/13/14 0600  Gross per 24 hour  Intake 1729.17 ml  Output    500 ml  Net 1229.17 ml     Filed Weights   02/12/14 1231 02/13/14 0300  Weight: 77.111 kg (170 lb) 74.5 kg (164 lb 3.9 oz)    Exam:     Afebrile, vital signs stable. No hypoxia. Gen. Appears calm, comfortable, nontoxic but acutely and chronically ill.  Psych. Alert. Speech fluent and clear. Oriented to self, location, month, year.  Eyes. Pupils grossly normal  Cardiovascular. Regular rate and rhythm. No murmur, rub or gallop. 2+ bilateral pedal edema. Dorsalis  pedis pulses 1+ bilaterally.  Respiratory. Clear to auscultation bilaterally. No wheezes, rales or rhonchi. Normal respiratory effort.  Abdomen. Soft, nontender, nondistended.  Skin. Chronic skin changes bilateral lower extremities, dry skin some chronic foot  lesions.  Musculoskeletal. Able to lift both legs off the bed without difficulty, strength 4/5 difficult to appreciate patellar reflexes. Able to liftt right arm off the bed and left arm but very weak.Mobility left shoulder limited secondary to history of proximal humeral fracture.   Neurologic. Normal rectal tone. Cranial nerves grossly unremarkable. Tremulous with movement of all extremities.  Data Reviewed:  Chemistry:  Potassium 2.9. Sodium improved to 134. Magnesium 1.3. AST trending downwards. CK minimally elevated to 63.  Heme:  Hemoglobin 9.1, platelet count 137.  Other:  EKG poor quality but appears to be sinus rhythm not atrial fibrillation.  Imaging: chest x-ray no acute disease. Lumbar spine film without acute findings. CT head no acute abnormalities but significant progression of generalized atrophy suggestive of progressive chronic microvascular ischemia. Remote lacunar infarcts. Left shoulder x-ray without acute findings. MRI thoracic spine with disc protrusions T5-6, T6-7 with mild cord flattening and increased signal, acuity and significance unclear. No posttraumatic deformities, compression fracture or spinal hematoma. Lumbar spine MRI with multilevel spondylosis. No critical spinal stenosis compression deformity or significant malalignment.  Scheduled Meds: . heparin  5,000 Units Subcutaneous 3 times per day  . LORazepam  0-4 mg Intravenous Q6H   Or  . LORazepam  0-4 mg Oral Q6H  . [START ON 02/14/2014] LORazepam  0-4 mg Oral Q12H  . pneumococcal 23 valent vaccine  0.5 mL Intramuscular Tomorrow-1000  . potassium chloride  40 mEq Oral TID  . thiamine  100 mg Intravenous Daily  . thiamine  100 mg Oral Daily   Continuous Infusions: . sodium chloride 1,000 mL (02/13/14 0505)    Principal Problem:   Alcohol withdrawal syndrome Active Problems:   Weakness generalized   Leg weakness, bilateral   Bilateral leg weakness   Other pancytopenia   Alcohol dependence    Tobacco dependence   Time spent 35 minutes, Greater than 50% counseling and coordination of care.

## 2014-02-13 NOTE — Progress Notes (Signed)
CRITICAL VALUE ALERT  Critical value received:  Potassium 2.9  Date of notification:  02/13/14  Time of notification:  0730  Critical value read back:Yes.    Nurse who received alert:  Reva BoresJanet Anjela Cassara, RN  MD notified (1st page):  Dr Irene LimboGoodrich  Time of first page:  0732  MD notified (2nd page):  Time of second page:  Responding MD:  Dr Irene LimboGoodrich  Time MD responded:  [phone]

## 2014-02-14 DIAGNOSIS — F1020 Alcohol dependence, uncomplicated: Secondary | ICD-10-CM

## 2014-02-14 DIAGNOSIS — D61818 Other pancytopenia: Secondary | ICD-10-CM

## 2014-02-14 DIAGNOSIS — E43 Unspecified severe protein-calorie malnutrition: Secondary | ICD-10-CM

## 2014-02-14 LAB — CBC
HEMATOCRIT: 31.5 % — AB (ref 39.0–52.0)
HEMOGLOBIN: 10.6 g/dL — AB (ref 13.0–17.0)
MCH: 30.6 pg (ref 26.0–34.0)
MCHC: 33.7 g/dL (ref 30.0–36.0)
MCV: 91 fL (ref 78.0–100.0)
Platelets: 154 10*3/uL (ref 150–400)
RBC: 3.46 MIL/uL — AB (ref 4.22–5.81)
RDW: 18.5 % — ABNORMAL HIGH (ref 11.5–15.5)
WBC: 2.4 10*3/uL — ABNORMAL LOW (ref 4.0–10.5)

## 2014-02-14 LAB — BASIC METABOLIC PANEL
Anion gap: 17 — ABNORMAL HIGH (ref 5–15)
BUN: 17 mg/dL (ref 6–23)
CHLORIDE: 101 meq/L (ref 96–112)
CO2: 19 mEq/L (ref 19–32)
Calcium: 8.6 mg/dL (ref 8.4–10.5)
Creatinine, Ser: 0.73 mg/dL (ref 0.50–1.35)
GFR calc Af Amer: >90 mL/min (ref >90)
GFR calc non Af Amer: >90 mL/min (ref >90)
Glucose, Bld: 117 mg/dL — ABNORMAL HIGH (ref 70–99)
POTASSIUM: 3.2 meq/L — AB (ref 3.7–5.3)
Sodium: 137 mEq/L (ref 137–147)

## 2014-02-14 LAB — MAGNESIUM: Magnesium: 1.9 mg/dL (ref 1.5–2.5)

## 2014-02-14 LAB — PHOSPHORUS: Phosphorus: 4.2 mg/dL (ref 2.3–4.6)

## 2014-02-14 MED ORDER — ENSURE COMPLETE PO LIQD
237.0000 mL | Freq: Two times a day (BID) | ORAL | Status: DC
  Administered 2014-02-15 – 2014-02-16 (×4): 237 mL via ORAL

## 2014-02-14 MED ORDER — POTASSIUM CHLORIDE CRYS ER 20 MEQ PO TBCR
40.0000 meq | EXTENDED_RELEASE_TABLET | Freq: Four times a day (QID) | ORAL | Status: AC
  Administered 2014-02-14 (×3): 40 meq via ORAL
  Filled 2014-02-14 (×3): qty 2

## 2014-02-14 MED ORDER — ADULT MULTIVITAMIN LIQUID CH
5.0000 mL | Freq: Every day | ORAL | Status: DC
  Administered 2014-02-15 – 2014-02-16 (×2): 5 mL via ORAL
  Filled 2014-02-14 (×4): qty 5

## 2014-02-14 NOTE — Progress Notes (Signed)
Have made multiple attempts to get patient to eat.  Pt refuses food stating he does not want it.  Pt now having a hard time taking oral medication.  Pt medication was given with ensure pudding this morning and last night.  Pt requires prompts to swallow.  Will continue to monitor and will try to crush meds during next medication administration.

## 2014-02-14 NOTE — Progress Notes (Signed)
INITIAL NUTRITION ASSESSMENT   INTERVENTION: Ensure Complete po BID, each supplement provides 350 kcal and 13 grams of protein   NUTRITION DIAGNOSIS: Inadequate oral intake related to irregular meal pattern and alcohol intake as evidenced by pt medical and  diet hx   Goal: Pt to meet >/= 90% of their estimated nutrition needs     Monitor: Po intake, labs and wt trends    Reason for Assessment: consul to assess nutrition status/ requirements  73 y.o. male  Admitting Dx: Alcohol withdrawal syndrome  ASSESSMENT:  Pt appetite very poor. He has not eaten since admission. He says sometimes "I go 2-3 days without eating". Pt is agreeable to take Ensure for now pending improvement of his appetite. Nutrition exam deferred at this time due to pt lethargy.  Pt last assessed by RD 09/12/2011 admission dx then was Alcohol abuse, left humerus fracture, metabolic acidosis, FTT. At that time his wt 161# which is within his current range. He has hx of ETOH abuse and malnutrition related to limited intake and hx of unplanned wt loss. His po intake continues to be insufficient likely related to limited resources and his alcohol abuse.  Pt continues to meet criteria for severe malnutrition in the context of social or environmental circumstances.  Height: Ht Readings from Last 1 Encounters:  02/13/14 6' (1.829 m)    Weight: Wt Readings from Last 1 Encounters:  02/13/14 164 lb 3.9 oz (74.5 kg)    Ideal Body Weight: 178# (80.9 kg)  % Ideal Body Weight: 92%  Wt Readings from Last 10 Encounters:  02/13/14 164 lb 3.9 oz (74.5 kg)  09/14/11 177 lb 11.1 oz (80.6 kg)    Usual Body Weight: 165-170#  % Usual Body Weight: 99%  BMI:  Body mass index is 22.27 kg/(m^2).normal range  Estimated Nutritional Needs: Kcal: 2184-2430 Protein: 97-118 gr Fluid: >2200 ml daily  Skin: intact  Diet Order: General  EDUCATION NEEDS: -Education not appropriate at this time   Intake/Output Summary (Last  24 hours) at 02/14/14 1603 Last data filed at 02/14/14 0800  Gross per 24 hour  Intake   1110 ml  Output   1200 ml  Net    -90 ml    Last BM: 02/13/14   Labs:   Recent Labs Lab 02/12/14 1303 02/12/14 1344 02/13/14 0623 02/13/14 0626 02/14/14 0537  NA 130*  --   --  134* 137  K 3.6*  --   --  2.9* 3.2*  CL 91*  --   --  99 101  CO2 19  --   --  19 19  BUN 30*  --   --  19 17  CREATININE 0.77  --   --  0.65 0.73  CALCIUM 9.4  --   --  8.2* 8.6  MG  --  1.5 1.3*  --  1.9  PHOS  --   --   --   --  4.2  GLUCOSE 156*  --   --  110* 117*    CBG (last 3)  No results found for this basename: GLUCAP,  in the last 72 hours  Scheduled Meds: . [START ON 02/15/2014] feeding supplement (ENSURE COMPLETE)  237 mL Oral BID BM  . heparin subcutaneous  5,000 Units Subcutaneous 3 times per day  . LORazepam  0-4 mg Intravenous Q6H   Or  . LORazepam  0-4 mg Oral Q6H  . LORazepam  0-4 mg Oral Q12H  . pneumococcal 23 valent vaccine  0.5 mL Intramuscular Tomorrow-1000  . potassium chloride  40 mEq Oral QID  . thiamine  100 mg Intravenous Daily  . thiamine  100 mg Oral Daily    Continuous Infusions: . sodium chloride 1,000 mL (02/14/14 0600)    Past Medical History  Diagnosis Date  . Arthritis   . Gout   . Hypertension   . Fracture     left arm    History reviewed. No pertinent past surgical history.  Royann Shivers MS,RD,CSG,LDN Office: [phone] Pager: [phone]

## 2014-02-14 NOTE — Progress Notes (Signed)
PROGRESS NOTE  Rennie Rouch ZOX:[id number] DOB: May 30, 1941 DOA: 02/12/2014 PCP: Catalina Pizza, MD has not seen in 2 years. Not on any medications.  Summary: 73 year old man with history of alcohol dependence, recurrent falls and gait ataxia for the last 3 months presented with a history of having fallen, laying on the floor for approximately 20 hours. Continues to drink alcohol daily. Admitted for alcohol withdrawal.   Assessment/Plan: 1. Suspected alcohol withdrawal with possible acute encephalopathy. Appears somewhat improved today. More alert. Minimal Ativan requirement. 2. Bilateral lower extremity weakness. Globally weak. No paralysis. MRI findings discussed with neurosurgery. 3. 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 Suspect chronic. Discussed with neurosurgery, these findings are insignificant and did not explain his generalized weakness. No further evaluation recommended. 4. Gait ataxia for 3 months secondary to imbalance and generalized weakness. History of falls. Uses cane at home by report. 5. Pancytopenia. Likely related to alcohol dependence. Thrombocytopenia has resolved. Remains anemic and leukopenic, both stable. 6. Hypokalemia. Replete. Hypomagnesemia resolved. Phosphorus normal. 7. Tobacco dependence. 8. Alcohol dependence. 9. Poor social situation. Lives in a motel. No PCP or current outpatient medications. Discussed with friend [place] 7/11. The patient consumes several pints of alcohol a day, eats very little, has one daughter who is not involved in his life. No other family. 10. Suspect severe protein calorie malnutrition.   Continue to monitor for alcohol withdrawal. Continue CIWA.  PT consult. Social work consult. I suspect he will need rehabilitation.  Monitor for refeeding syndrome. Check magnesium, phosphorus, Basic metabolic panel in the morning.  Poor prognosis. Chronically ill, poor nutrition.  Code Status: full code DVT  prophylaxis: SCDs, heparin Family Communication: none present  Disposition Plan: pending PT eval  Brendia Sacks, MD  [facility]  Pager [phone] If 7PM-7AM, please contact night-coverage at [email or web address], password [facility] 02/14/2014, 2:50 PM  LOS: 2 days   Consultants:    Procedures:    Antibiotics:    HPI/Subjective: Poor appetite. Eating very little. Needs prompts to swallow.  Complains of pain all over. Feels very weak.Marland Kitchen Poor appetite.  Objective: Filed Vitals:   02/13/14 1441 02/13/14 2126 02/14/14 0300 02/14/14 0558  BP: 173/70 155/93 162/76 174/79  Pulse: 95 76 94 83  Temp: 99.2 F (37.3 C)   97.9 F (36.6 C)  TempSrc: Axillary   Axillary  Resp: 20   11  Height:      Weight:      SpO2: 100% 96%  100%    Intake/Output Summary (Last 24 hours) at 02/14/14 1450 Last data filed at 02/14/14 0800  Gross per 24 hour  Intake   1110 ml  Output   1200 ml  Net    -90 ml     Filed Weights   02/12/14 1231 02/13/14 0300  Weight: 77.111 kg (170 lb) 74.5 kg (164 lb 3.9 oz)    Exam:     Afebrile, vital signs stable. No hypoxia. Gen. Appears calm and mildly uncomfortable, chronically ill. Psych. Alert. Speech fluent and clear. Grossly normal mentation. Cardiovascular. Regular rate and rhythm. No murmur, rub or gallop. 1+ bilateral lower extremity edema. Respiratory. Clear to auscultation bilaterally. No wheezes, rales or rhonchi. Normal respiratory effort. Skin. No change. Musculoskeletal. Very poor effort. Not willing to comply with exam. Neurologic. Grossly nonfocal.  Data Reviewed: I/O: Urine output 1200, BM x1 Chemistry: Potassium 3.2. Phosphorus, magnesium normal. Basic metabolic panel unremarkable. Heme: Leukopenia stable 2.4. Hemoglobin stable 10.6.  Scheduled Meds: . heparin subcutaneous  5,000 Units  Subcutaneous 3 times per day  . LORazepam  0-4 mg Intravenous Q6H   Or  . LORazepam  0-4 mg Oral Q6H  . LORazepam  0-4 mg Oral Q12H  .  pneumococcal 23 valent vaccine  0.5 mL Intramuscular Tomorrow-1000  . potassium chloride  40 mEq Oral QID  . thiamine  100 mg Intravenous Daily  . thiamine  100 mg Oral Daily   Continuous Infusions: . sodium chloride 1,000 mL (02/14/14 0600)    Principal Problem:   Alcohol withdrawal syndrome Active Problems:   Weakness generalized   Leg weakness, bilateral   Bilateral leg weakness   Other pancytopenia   Alcohol dependence   Tobacco dependence   Time spent 20 minutes

## 2014-02-15 DIAGNOSIS — E872 Acidosis, unspecified: Secondary | ICD-10-CM

## 2014-02-15 DIAGNOSIS — R5383 Other fatigue: Secondary | ICD-10-CM

## 2014-02-15 DIAGNOSIS — F10239 Alcohol dependence with withdrawal, unspecified: Secondary | ICD-10-CM | POA: Diagnosis not present

## 2014-02-15 DIAGNOSIS — F10939 Alcohol use, unspecified with withdrawal, unspecified: Secondary | ICD-10-CM | POA: Diagnosis not present

## 2014-02-15 DIAGNOSIS — R5381 Other malaise: Secondary | ICD-10-CM

## 2014-02-15 LAB — BASIC METABOLIC PANEL
Anion gap: 16 — ABNORMAL HIGH (ref 5–15)
BUN: 20 mg/dL (ref 6–23)
CO2: 15 mEq/L — ABNORMAL LOW (ref 19–32)
CREATININE: 1.05 mg/dL (ref 0.50–1.35)
Calcium: 8.7 mg/dL (ref 8.4–10.5)
Chloride: 107 mEq/L (ref 96–112)
GFR, EST AFRICAN AMERICAN: 80 mL/min — AB (ref >90)
GFR, EST NON AFRICAN AMERICAN: 69 mL/min — AB (ref >90)
GLUCOSE: 118 mg/dL — AB (ref 70–99)
Potassium: 4.1 mEq/L (ref 3.7–5.3)
Sodium: 138 mEq/L (ref 137–147)

## 2014-02-15 LAB — PHOSPHORUS: Phosphorus: 3.6 mg/dL (ref 2.3–4.6)

## 2014-02-15 LAB — MAGNESIUM: Magnesium: 1.7 mg/dL (ref 1.5–2.5)

## 2014-02-15 NOTE — Clinical Social Work Psychosocial (Signed)
Clinical Social Work Department BRIEF PSYCHOSOCIAL ASSESSMENT 02/15/2014  Patient:  Jacob Riddle, Jacob Riddle     Account Number:  [id number]     Admit date:  02/12/2014  Clinical Social Worker:  Wyatt Haste  Date/Time:  02/15/2014 01:21 PM  Referred by:  Physician  Date Referred:  02/15/2014 Referred for  Psychosocial assessment  SNF Placement   Other Referral:   Interview type:  Patient Other interview type:    PSYCHOSOCIAL DATA Living Status:  ALONE Admitted from facility:   Level of care:   Primary support name:  friends Primary support relationship to patient:  FRIEND Degree of support available:   limited    CURRENT CONCERNS Current Concerns  Post-Acute Placement  Substance Abuse   Other Concerns:    SOCIAL WORK ASSESSMENT / PLAN CSW met with pt at bedside. Pt alert and oriented, but did not talk beyond 1-2 word responses. He states that he has lived at a motel for some time now. Pt said he has no family, although MD reports he does have a daughter but she does not want to be involved at all due to his ETOH abuse. Pt indicates his "buddies" are his best support. They bring him food. Referral states pt is unable to provide food for himself. He indicates that he has enough. CSW discussed ETOH with pt. He was especially quiet regarding this subject. Admits to drinking several shots of bourbon daily. He does not feel this is a problem for him, but would like to stop. CSW attempted to engage pt further in conversation, but he turned head and closed his eyes. CSW then discussed d/c plan and pt was willing to talk. PT evaluated pt today and recommendation is for SNF. Pt states he has been to [facility] in the past. He is open to return to SNF for rehab. At the motel, pt was having to crawl to bathroom. He is agreeable to SNF in [facility], but would prefer [place] if possible. SNF list left in room. Aware of Medicare coverage/criteria. Pt states there is no one CSW needs to call to  discuss SNF further.   Assessment/plan status:  Psychosocial Support/Ongoing Assessment of Needs Other assessment/ plan:   Information/referral to community resources:   SNF list  Older Adults and Alcohol booklet    PATIENT'S/FAMILY'S RESPONSE TO PLAN OF CARE: Pt reports positive feelings regarding SNF for rehab. CSW will initiate bed search and follow up with bed offers when available. CSW unable to complete SBIRT due to pt's reluctance to discuss ETOH use.       Benay Pike, [place]

## 2014-02-15 NOTE — Progress Notes (Signed)
PROGRESS NOTE  Jacob LeavensJohn Riddle VHQ:[id number] DOB: [birth year] DOA: 02/12/2014 PCP: Jacob PizzaHALL, ZACH, MD has not seen in 2 years. Not on any medications.  Summary: 73 year old man with history of alcohol dependence, recurrent falls and gait ataxia for the last 3 months presented with a history of having fallen, laying on the floor for approximately 20 hours. Continues to drink alcohol daily. Admitted for alcohol withdrawal, bilateral lower extremity weakness, history of falls. Abnormal MRI findings discussed with neurosurgery, no indication for further evaluation. Generally weak but no focal deficits. Alcohol withdrawal was mild with minimal lorazepam requirement.  Assessment/Plan: 1. Suspected alcohol withdrawal with possible acute encephalopathy. Acute withdrawal appears to be resolving with only some tremulousness. Minimal Ativan requirement. 2. Bilateral lower extremity weakness. Globally weak. No paralysis. MRI findings discussed with neurosurgery. 3. 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 Suspect chronic. Discussed with neurosurgery, these findings are insignificant and did not explain his generalized weakness. No further evaluation recommended. 4. Borderline AG metabolic acidosis of unclear etiology. Monitor clinically. 5. Gait ataxia for 3 months secondary to imbalance and generalized weakness. History of falls. 6. Pancytopenia. Likely related to alcohol dependence. Thrombocytopenia has resolved. Remains anemic and leukopenic, both stable. 7. Hypokalemia. Repleted. Hypomagnesemia resolved. Phosphorus normal. 8. Tobacco dependence. 9. Alcohol dependence. Recommend cessation. 10. Poor social situation. Very poor social support. The patient consumes several pints of alcohol a day, eats very little, has one daughter who is not involved in his life. No other family.   Overall somewhat improved with improved alertness and oral intake. Kidney function stable. Plan  continue current treatment, skilled nursing facility placement for rehabilitation.  BMP in a.m.  Code Status: full code DVT prophylaxis: SCDs, heparin Family Communication: none present  Disposition Plan: pending PT eval  Jacob Sacksaniel Daquawn Seelman, MD  [facility]  Pager [phone] If 7PM-7AM, please contact night-coverage at [email or web address], password [facility] 02/15/2014, 12:41 PM  LOS: 3 days   Consultants:  Occupational therapy  Physical therapy: Skilled nursing facility  Procedures:    Antibiotics:    HPI/Subjective: Poor participation with therapy.  He reports generalized body aches, chronic pain. Reports he has had difficulty ambulating for the last several months.  Objective: Filed Vitals:   02/14/14 0300 02/14/14 0558 02/14/14 2100 02/15/14 0340  BP: 162/76 174/79 141/75 167/95  Pulse: 94 83 96 115  Temp:  97.9 F (36.6 C) 99.8 F (37.7 C) 98.2 F (36.8 C)  TempSrc:  Axillary Axillary Oral  Resp:  11 20 21   Height:      Weight:      SpO2:  100% 100% 100%    Intake/Output Summary (Last 24 hours) at 02/15/14 1241 Last data filed at 02/15/14 0700  Gross per 24 hour  Intake   3315 ml  Output    550 ml  Net   2765 ml     Filed Weights   02/12/14 1231 02/13/14 0300  Weight: 77.111 kg (170 lb) 74.5 kg (164 lb 3.9 oz)    Exam:    Afebrile, tachycardic, vitals otherwise stable. No hypoxia. Gen. Appears calm, comfortable. Nontoxic. Chronically ill. Psych. Much more alert today, more interactive. Follows commands. Cardiovascular. Regular rate and rhythm. No murmur, rub or gallop. No significant lower extremity edema. Respiratory. Clear to auscultation bilaterally. No wheezes, rales or rhonchi. Normal respiratory effort. Abdomen. Soft, nontender, nondistended Skin. No acute changes Musculoskeletal. Moves all extremities to command but requires a lot of verbal coaching and encouragement. It will be left both legs off  the bed. Able to lift both arms somewhat but  range of motion is limited by pain. Neurologic. Grossly nonfocal.   Data Reviewed: I/O: Much improved oral intake, 1560. Chemistry: Potassium normalized. Magnesium and phosphorus normal.  Scheduled Meds: . feeding supplement (ENSURE COMPLETE)  237 mL Oral BID BM  . heparin subcutaneous  5,000 Units Subcutaneous 3 times per day  . LORazepam  0-4 mg Oral Q12H  . multivitamin  5 mL Oral Daily  . thiamine  100 mg Oral Daily   Continuous Infusions:    Principal Problem:   Alcohol withdrawal syndrome Active Problems:   Weakness generalized   Leg weakness, bilateral   Bilateral leg weakness   Other pancytopenia   Alcohol dependence   Tobacco dependence   Time spent 20 minutes

## 2014-02-15 NOTE — Progress Notes (Signed)
OT Cancellation Note  Patient Details Name: Jacob LeavensJohn Riddle MRN: [id number] DOB: 1940/12/04   Cancelled Treatment:    Reason Eval/Treat Not Completed: Patient declined, no reason specified.  OTR began evaluation, giving pt water as requested (pt's hands in mits at this time).  Pt able to respond that he fell, but with further questions related to evaluation pt stated "I don't want to answer any more questions right now."  With furhter attempts to evaluate pt's performance, pt stated "I don't want to do anything right now. I just want to be left alone."  OTR prepped pt for arrival of PT later this AM. OTR also suggested she would be back late this afternoon (pt reported that he would try therapy then).  Marry GuanMarie Rawlings, MS, OTR/L [phone] 02/15/2014, 8:34 AM

## 2014-02-15 NOTE — Progress Notes (Addendum)
OT Cancellation Note  Patient Details Name: Jacob LeavensJohn Orellana MRN: [id number] DOB: [birth year]   Cancelled Treatment:    Reason Eval/Treat Not Completed: Other (comment) (Reason Eval/Treat Not Completed: Other (comment) (CSW interviewing pt at time of re-attempted eval.  Will try OT eval again at later time/date.)  Marry GuanMarie Rawlings, MS, OTR/L [phone] 02/15/2014, 1:41 PM

## 2014-02-15 NOTE — Clinical Social Work Placement (Signed)
Clinical Social Work Department CLINICAL SOCIAL WORK PLACEMENT NOTE 02/15/2014  Patient:  Rhona LeavensMCCUISTON,Madix  Account Number:  [id number] Admit date:  02/12/2014  Clinical Social Worker:  Derenda FennelKARA Karla Vines, LCSW  Date/time:  02/15/2014 01:19 PM  Clinical Social Work is seeking post-discharge placement for this patient at the following level of care:   SKILLED NURSING   (*CSW will update this form in Epic as items are completed)   02/15/2014  Patient/family provided with [facility] of Clinical Social Work's list of facilities offering this level of care within the geographic area requested by the patient (or if unable, by the patient's family).  02/15/2014  Patient/family informed of their freedom to choose among providers that offer the needed level of care, that participate in Medicare, Medicaid or managed care program needed by the patient, have an available bed and are willing to accept the patient.  02/15/2014  Patient/family informed of [facility]' ownership interest in [facility], as well as of the fact that they are under no obligation to receive care at this facility.  PASARR submitted to EDS on  PASARR number received on   FL2 transmitted to all facilities in geographic area requested by pt/family on  02/15/2014 FL2 transmitted to all facilities within larger geographic area on   Patient informed that his/her managed care company has contracts with or will negotiate with  certain facilities, including the following:     Patient/family informed of bed offers received:   Patient chooses bed at  Physician recommends and patient chooses bed at    Patient to be transferred to  on   Patient to be transferred to facility by  Patient and family notified of transfer on  Name of family member notified:    The following physician request were entered in Epic:   Additional Comments: Pt has existing pasarr.  Derenda FennelKara Bret Vanessen, [place]CS[place] [phone]

## 2014-02-15 NOTE — Evaluation (Signed)
Physical Therapy Evaluation Patient Details Name: Jacob LeavensJohn Riddle MRN: [id number] DOB: 11-18-1940 Today's Date: 02/15/2014   History of Present Illness  This is a 73 year old man who has a history of alcoholism and he presents to the emergency room after he had some sort of fall couple of days ago. He stayed on the floor for over 20 hours before he was able to get up. Apparently he has had difficulty with walking for the last 3 months as he loses his balance and feels weak. He apparently has not had alcohol for the last 2 days and evaluation in the emergency room was suggestive of alcohol withdrawal. He is now being admitted for further management. He lives in a motel.  Clinical Impression  Pt is a 73 year old male who presents to physical therapy with dx of alcohol withdrawal syndrome.  Pt lives alone in a motel, though per case management pt will be unable to return to motel.  Pt reports he was mod (I) with bed mobility skills, transfers, and amb with use of std cane.  Pt did have a fall and was on the floor for 20 hours as he was unable stand; pt unable to report is this was his first fall or if he has a history of falls.  Noted pt refused OT services this morning for assessment, though did agree to attempt work with PT later in the morning.  Pt required max assist x1 to transfer to EOB.  Pt reports increased complaints of pain to "hurts a whole lot" during movement; unable to specify specific location only generalized pain.  Noted facial grimacing and moaning with movement of LE and hands during transfer.  Pt refused attempts to stand stating he "needed to rest".  Educated pt on importance of OOB activity to gain strength and increase activity tolerance; will attempt further evaluation of functional mobility skills as pt allows for transfers and gait.  Recommend continued PT while in the hospital to address pain management, strengthening, activity tolerance, and balance for improvement of functional  mobility skills with transition to SNF for continued rehab and 24/7 care.  Unknown if pt will tolerate SNF for only PT services, with recommendation for continued attempts for OT and possible ST services to qualify for rehab.  Unknown DME recommendations at this time as pt refused further functional mobility skills; anticipate pt will require increased DME for functional mobility (RW vs. Rollator), will defer to SNF for DME recommendations.      Follow Up Recommendations SNF    Equipment Recommendations   (Unknown as pt refused further mobility skills, will defer to SNF for DME recommendations)       Precautions / Restrictions Precautions Precautions: Fall Restrictions Weight Bearing Restrictions: No      Mobility  Bed Mobility Overal bed mobility: Needs Assistance Bed Mobility: Supine to Sit;Sit to Supine     Supine to sit: Max assist (for trunk and LE) Sit to supine: Max assist (for trunk and LE)   General bed mobility comments: Hand mitts removed prior to transfer so pt would be able to assist with UE; pt unable to use hands to assist secondary to pain.    Transfers                 General transfer comment: Pt refused despite encouragment and education on importance of OOB activity to increase strength and functional mobility.   Ambulation/Gait             General Gait Details:  Pt refused despite encouragment and education on importance of OOB activity to increase strength and functional mobility.       Balance Overall balance assessment: Needs assistance (Per chart, pt had a fall and remained on floor for 20 hours as pt unable to return to standing) Sitting-balance support: Feet supported (Pt uable to WB through UE to support balance secondary to pain in (B) hands) Sitting balance-Leahy Scale: Poor         Standing balance comment: Unknown as pt refused standing.                              Pertinent Vitals/Pain FACES 8/10 with transfer to  EOB    Home Living Family/patient expects to be discharged to:: Unsure Living Arrangements: Alone               Additional Comments: Pt lives alone in a motel; per case management pt will not be able to return to the motel.  DME : cane    Prior Function Level of Independence: Independent with assistive device(s)         Comments: Pt reports he was mod (I) with bed mobility skills, transfers, and amb with use of std cane in the Rt hand.       Hand Dominance   Dominant Hand: Right    Extremity/Trunk Assessment   Upper Extremity Assessment: Defer to OT evaluation (Noted increased swelling on (B) hands, with hands fisted.  Attempted to open fingers (to use hands to grip bed rail during transfer), and increased complaints of pain reported and inability to activity move fingers secondary to pain.  )           Lower Extremity Assessment: Generalized weakness;RLE deficits/detail;LLE deficits/detail RLE Deficits / Details: Unable to follow commands secondary to pain, decreased attention to activity.  LLE Deficits / Details: Unable to follow commands secondary to pain, decreased attention to activity.      Communication   Communication: No difficulties  Cognition Arousal/Alertness: Lethargic Behavior During Therapy: Restless Overall Cognitive Status: No family/caregiver present to determine baseline cognitive functioning                        Assessment/Plan    PT Assessment Patient needs continued PT services  PT Diagnosis Difficulty walking;Generalized weakness;Acute pain   PT Problem List Decreased strength;Decreased activity tolerance;Decreased safety awareness;Decreased balance;Decreased mobility;Pain  PT Treatment Interventions Balance training;Gait training;Neuromuscular re-education;Functional mobility training;Therapeutic activities;Therapeutic exercise;Patient/family education   PT Goals (Current goals can be found in the Care Plan section) Acute  Rehab PT Goals Patient Stated Goal: none stated    Frequency Min 3X/week   Barriers to discharge Inaccessible home environment;Decreased caregiver support         End of Session   Activity Tolerance: Patient limited by pain;Patient limited by fatigue Patient left: in bed;with call bell/phone within reach;with bed alarm set           Time: 1115-1140 PT Time Calculation (min): 25 min   Charges:   PT Evaluation $Initial PT Evaluation Tier I: 1 Procedure      Jacob Riddle 02/15/2014, 11:51 AM

## 2014-02-16 LAB — BASIC METABOLIC PANEL
ANION GAP: 15 (ref 5–15)
BUN: 26 mg/dL — ABNORMAL HIGH (ref 6–23)
CALCIUM: 9 mg/dL (ref 8.4–10.5)
CHLORIDE: 103 meq/L (ref 96–112)
CO2: 18 meq/L — AB (ref 19–32)
Creatinine, Ser: 1.29 mg/dL (ref 0.50–1.35)
GFR calc Af Amer: 63 mL/min — ABNORMAL LOW (ref >90)
GFR calc non Af Amer: 54 mL/min — ABNORMAL LOW (ref >90)
GLUCOSE: 124 mg/dL — AB (ref 70–99)
Potassium: 3.9 mEq/L (ref 3.7–5.3)
Sodium: 136 mEq/L — ABNORMAL LOW (ref 137–147)

## 2014-02-16 MED ORDER — AMLODIPINE BESYLATE 5 MG PO TABS: 5.0000 mg | ORAL_TABLET | Freq: Every day | ORAL | Status: DC

## 2014-02-16 MED ORDER — HYDROCODONE-ACETAMINOPHEN 5-325 MG PO TABS: 1.0000 | ORAL_TABLET | Freq: Four times a day (QID) | ORAL | Status: DC | PRN

## 2014-02-16 MED ORDER — ENSURE COMPLETE PO LIQD: 237.0000 mL | Freq: Two times a day (BID) | ORAL | Status: DC

## 2014-02-16 MED ORDER — MULTIVITAMINS PO CAPS: 1.0000 | ORAL_CAPSULE | Freq: Every day | ORAL | Status: AC

## 2014-02-16 MED ORDER — ACETAMINOPHEN 325 MG PO TABS: 650.0000 mg | ORAL_TABLET | Freq: Three times a day (TID) | ORAL | Status: AC | PRN

## 2014-02-16 NOTE — Plan of Care (Signed)
Problem: Phase III Progression Outcomes Goal: Voiding independently Outcome: Completed/Met Date Met:  02/16/14 Condom catheter in place. Jacob Riddle

## 2014-02-16 NOTE — Clinical Social Work Note (Signed)
CSW presented bed offers and pt accepts [facility]. Facility notified. Pt d/c today. [facility] and [facility] aware and [facility]. CSW attempted to reach pt's friend, [place] at his request, but number on chart not working and pt does not have number. Pt notified. To [facility]. D/C summary faxed.  Derenda FennelKara Rashiya Lofland, [place]CS[place] [phone]

## 2014-02-16 NOTE — Care Management Note (Signed)
    Page 1 of 1   02/16/2014     12:23:24 PM CARE MANAGEMENT NOTE 02/16/2014  Patient:  Jacob Riddle,Jacob Riddle   Account Number:  [id number]  Date Initiated:  02/15/2014  Documentation initiated by:  Anibal HendersonBOLDEN,Elchanan Bob  Subjective/Objective Assessment:   admitted with alcohol withdrawal. Patient lives alone in a motel, and is checked on occasionally by the other residents. The owner of the motel came by to speak to this CM, and states he will not take the patient back. He states the room is     Action/Plan:   filthy, and he has called [facility]PS, because the patient lives in this very dirty room and does nothing but drink ETOH. Notified CSW. Patient has decided he will go to SNF   Anticipated DC Date:  02/15/2014   Anticipated DC Plan:  SKILLED NURSING FACILITY  In-house referral  Clinical Social Worker      DC Planning Services  CM consult      Choice offered to / List presented to:             Status of service:  Completed, signed off Medicare Important Message given?  YES (If response is "NO", the following Medicare IM given date fields will be blank) Date Medicare IM given:  02/16/2014 Medicare IM given by:  Anibal HendersonBOLDEN,Leslee Haueter Date Additional Medicare IM given:   Additional Medicare IM given by:    Discharge Disposition:  SKILLED NURSING FACILITY  Per UR Regulation:  Reviewed for med. necessity/level of care/duration of stay  If discussed at Long Length of Stay Meetings, dates discussed:    Comments:  02/16/14 1215 Anibal HendersonGeneva Charmian Forbis RN/CM  D/C to [facility] 02/15/14 1700 , Anibal HendersonGeneva Sibyl Mikula RN/CM

## 2014-02-16 NOTE — Progress Notes (Signed)
Pt d/c to [facility] via EMS at this time. IV was d/c prior to d/c. Pt belongings were returned to him, Altamese CarolinaAshley Manley,  NT witnessed this. EMS and nurse at [facility] are aware that pt has his belongings in belonging bad- checkbook and $300 cash. Condom catheter in place at time of d/c. [facility] Ny[facility]indy [facility]. Sheryn BisonGordon, Tilley Faeth Warner

## 2014-02-16 NOTE — Discharge Summary (Addendum)
Physician Discharge Summary  Jacob Riddle ZOX:[id number] DOB: [birth year] DOA: 02/12/2014  PCP: Catalina Pizza, MD  Admit date: 02/12/2014 Discharge date: 02/16/2014  Time spent: 35 minutes  Recommendations for Outpatient Follow-up:  1. Recommend repeat BMP in 3 days  2. PCP follow up in 1-2 weeks  Discharge Diagnoses:  Principal Problem:   Alcohol withdrawal syndrome Active Problems:   Weakness generalized   Leg weakness, bilateral   Bilateral leg weakness   Other pancytopenia   Alcohol dependence   Tobacco dependence  Discharge Condition: stable  Diet recommendation: regular  Filed Weights   02/12/14 1231 02/13/14 0300  Weight: 77.111 kg (170 lb) 74.5 kg (164 lb 3.9 oz)   History of present illness:  This is a 73 year old man who has a history of alcoholism and he presents to the emergency room after he had some sort of fall couple of days ago. He stayed on the floor for over 20 hours before he was able to get up. Apparently he has had difficulty with walking for the last 3 months as he loses his balance and feels weak. He apparently has not had alcohol for the last 2 days and evaluation in the emergency room was suggestive of alcohol withdrawal. He is now being admitted for further management. He lives in a motel.   Hospital Course:  Suspected alcohol withdrawal with possible acute encephalopathy. Acute withdrawal appears resolved with only some mild residual tremulousness remaining.  Bilateral lower extremity weakness. Globally weak. No paralysis. MRI findings discussed by Dr. Irene Limbo with neurosurgery.  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 Suspect chronic. Dr. Irene Limbo discussed with neurosurgery, these findings are insignificant and did not explain his generalized weakness. No further evaluation recommended.  Borderline AG metabolic acidosis of unclear etiology. Monitor clinically. Stable, repeat BMP in 3 days at rehab.  Gait ataxia  for 3 months secondary to imbalance and generalized weakness. History of falls.  Pancytopenia. Likely related to alcohol dependence. Thrombocytopenia has resolved. Remains anemic and leukopenic, both stable.  Hypokalemia. Repleted. Hypomagnesemia resolved. Phosphorus normal.  Tobacco dependence.  Alcohol dependence. Recommend cessation. Poor social situation.  HTN - will start Norvasc, monitor and further adjust as indicated.  severe malnutrition in the context of social or environmental circumstances  Procedures:  None    Consultations:  None   Discharge Exam: Filed Vitals:   02/15/14 0340 02/15/14 1438 02/15/14 2153 02/16/14 0611  BP: 167/95 180/83 194/81 156/75  Pulse: 115 91 84 91  Temp: 98.2 F (36.8 C) 98 F (36.7 C) 98 F (36.7 C) 97.5 F (36.4 C)  TempSrc: Oral  Oral Oral  Resp: 21 20 20 20   Height:      Weight:      SpO2: 100% 99% 100% 100%   General: NAD Cardiovascular: RRR Respiratory: CTA biL  Discharge Instructions     Medication List         acetaminophen 325 MG tablet  Commonly known as:  TYLENOL  Take 2 tablets (650 mg total) by mouth every 8 (eight) hours as needed for mild pain.     amLODipine 5 MG tablet  Commonly known as:  NORVASC  Take 1 tablet (5 mg total) by mouth daily.     feeding supplement (ENSURE COMPLETE) Liqd  Take 237 mLs by mouth 2 (two) times daily between meals.     HYDROcodone-acetaminophen 5-325 MG per tablet  Commonly known as:  NORCO/VICODIN  Take 1 tablet by mouth every 6 (six) hours  as needed for moderate pain.     multivitamin capsule  Take 1 capsule by mouth daily.         The results of significant diagnostics from this hospitalization (including imaging, microbiology, ancillary and laboratory) are listed below for reference.    Significant Diagnostic Studies: Dg Lumbar Spine Complete  02/12/2014   CLINICAL DATA:  Low back pain after falling out of bed 2 days ago.  EXAM: LUMBAR SPINE - COMPLETE 4+ VIEW   COMPARISON:  None.  FINDINGS: There is no fracture. Mild convex right scoliosis is noted. The patient has advanced multilevel facet and degenerative disc disease. 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  IMPRESSION: No acute finding.  Scoliosis and marked multilevel degenerative change.  Atherosclerosis.   Electronically Signed   By: Drusilla Kanner M.D.   On: 02/12/2014 14:28   Ct Head Wo Contrast  02/12/2014   CLINICAL DATA:  Fall.  Weakness.  EXAM: CT HEAD WITHOUT CONTRAST  TECHNIQUE: Contiguous axial images were obtained from the base of the skull through the vertex without intravenous contrast.  COMPARISON:  CT head without contrast 12/29/2003  FINDINGS: There is significant interval progression of diffuse atrophy and white matter disease. 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 No acute cortical infarct, hemorrhage, or mass lesion is present. Ventricular enlargement is proportionate to the degree of atrophy.  The paranasal sinuses and mastoid air cells are clear. The osseous skull is intact. Mild soft tissue swelling is noted above the right orbit. There is no underlying fracture.  IMPRESSION: 1. Significant interval progression of generalized atrophy and white matter disease. This is compatible with progressive chronic microvascular ischemia. 2. 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 3. No acute intracranial abnormality. 4. Question soft swelling in the right supraorbital scalp without an underlying fracture.   Electronically Signed   By: Gennette Pac M.D.   On: 02/12/2014 14:37   Mr Thoracic Spine Wo Contrast  02/12/2014   ADDENDUM REPORT: 02/12/2014 19:56  ADDENDUM: There is a speech recognition error. In the body of the report, third paragraph, first line,  the sentence should read:  At T6-7,.   Electronically Signed   By: Davonna Belling M.D.   On: 02/12/2014 19:56   02/12/2014   CLINICAL DATA:  Bilateral leg weakness with inability to  walk. History of a fall. History of alcoholism.  EXAM: MRI THORACIC SPINE WITHOUT CONTRAST  TECHNIQUE: Multiplanar, multisequence MR imaging of the thoracic spine was performed. No intravenous contrast was administered.  COMPARISON:  None.  FINDINGS: Numbering of the thoracic spine was accomplished by counting down from the odontoid. There is chronic disc space narrowing in the thoracic spine most notable at T5-T6. No intraspinal mass lesions or hematomas are evident. There is no worrisome osseous lesion. There is no compression fracture or traumatic subluxation. Paravertebral soft tissues unremarkable.  The axial images are significantly degraded by motion and accurate characterization is difficult. At T5-6, there is a shallow protrusion in the midline. On this motion degraded exam, the cord appears flattened with mild increased signal.  At C6-7, there is a central and leftward protrusion. On this motion degraded examination, the cord appears mildly flattened with disc material extending into the foramen on the left. There may be mild increased cord signal.  IMPRESSION: Disc protrusions T5-6 and T6-7 with mild cord flattening at both levels. Mild increased cord signal is suspected.  It is unclear if the observed findings are acute, and/or sufficiently  severe to result myelopathy. Clinical correlation recommended.  No obvious posttraumatic deformities such as compression fracture or spinal hematoma.  Electronically Signed: By: Davonna BellingJohn  Curnes M.D. On: 02/12/2014 19:46   Mr Lumbar Spine Wo Contrast  02/12/2014   CLINICAL DATA:  Low back pain.  Inability to walk.  Alcoholism.  EXAM: MRI LUMBAR SPINE WITHOUT CONTRAST  TECHNIQUE: Multiplanar, multisequence MR imaging of the lumbar spine was performed. No intravenous contrast was administered.  COMPARISON:  Plain films earlier today.  FINDINGS: No lumbar compression deformity or intraspinal hematoma. Normal conus. Advanced endplate reactive changes above and below L1-2  and L3-4, associated with this a scans in the oblique these are not worrisome based on the plain film appearance of chronic degenerative change, although in the appropriate clinical setting, with fever and elevated white count, this appearance could be associated with disc space infection. I feel this is unlikely however. Correlate clinically. Trace retrolisthesis L3-4 and L4-5 is facet mediated. No occult sacral fracture. Paravertebral and some was muscles appear fatty replaced, suggesting in activity. No aortic or renal abnormalities are evident.  Axial images are degraded by motion.  At L1-2, there is mild annular bulging without impingement. Slight lateral recess stenosis on the left relates to asymmetric loss of interspace height on that side.  At L2-L3 there is mild facet arthropathy and annular bulging without impingement.  At L3-L4, there is asymmetric loss of interspace height greater on the right with spurring along with mild facet arthropathy. Annular bulging is noted. No impingement.  At L4-5 there is a central protrusion without significant stenosis. Mild facet arthropathy without impingement.  Shallow protrusion at L5-S1 without impingement. Mild facet arthropathy.  IMPRESSION: Multilevel spondylosis as described. No areas of critical spinal stenosis, significant malalignment, or compression deformity.  Discal hyperintensity at L1-2 and L3-4 associated with marked endplate reactive changes are felt to be degenerative in nature. See discussion above.   Electronically Signed   By: Davonna BellingJohn  Curnes M.D.   On: 02/12/2014 20:06   Dg Chest Port 1 View  02/12/2014   CLINICAL DATA:  Fall, cough, weakness  EXAM: PORTABLE CHEST - 1 VIEW  COMPARISON:  Portal chest radiograph 09/11/2011  FINDINGS: The heart size and mediastinal contours are within normal limits. Atherosclerotic calcifications in the aorta. Both lungs are clear. The visualized skeletal structures are unremarkable.  IMPRESSION: No active disease.    Electronically Signed   By: Salome HolmesHector  Cooper M.D.   On: 02/12/2014 13:25   Dg Shoulder Left  02/12/2014   CLINICAL DATA:  Left shoulder pain status post fall 2 days ago.  EXAM: LEFT SHOULDER - 2+ VIEW  COMPARISON:  Left shoulder series of October 25, 2011  FINDINGS: The patient had sustained a comminuted angulated subcapital fracture of the left shoulder in February of 2013. The fracture has healed with residual deformity. No acute fracture is demonstrated. There is minimal degenerative change of the glenohumeral and of the AC joints. The left clavicle and observed left ribs are normal. There is old deformity of the posterior lateral aspect of the left seventh rib.  IMPRESSION: There is no acute fracture of the left shoulder. There is chronic deformity related to the patient's previous fracture.   Electronically Signed   By: David  Swazil[place]   On: 02/12/2014 15:28    Microbiology: No results found for this or any previous visit (from the past 240 hour(s)).   Labs: Basic Metabolic Panel:  Recent Labs Lab 02/12/14 1303 02/12/14 1344 02/13/14 [id number] 02/13/14 [id number]  02/14/14 0537 02/15/14 0626 02/16/14 0557  NA 130*  --   --  134* 137 138 136*  K 3.6*  --   --  2.9* 3.2* 4.1 3.9  CL 91*  --   --  99 101 107 103  CO2 19  --   --  19 19 15* 18*  GLUCOSE 156*  --   --  110* 117* 118* 124*  BUN 30*  --   --  19 17 20  26*  CREATININE 0.77  --   --  0.65 0.73 1.05 1.29  CALCIUM 9.4  --   --  8.2* 8.6 8.7 9.0  MG  --  1.5 1.3*  --  1.9 1.7  --   PHOS  --   --   --   --  4.2 3.6  --    Liver Function Tests:  Recent Labs Lab 02/12/14 1303 02/13/14 0626  AST 50* 46*  ALT 11 10  ALKPHOS 78 64  BILITOT 0.9 0.6  PROT 7.1 5.8*  ALBUMIN 2.3* 1.8*   CBC:  Recent Labs Lab 02/12/14 1303 02/13/14 0626 02/14/14 0537  WBC 4.2 2.5* 2.4*  NEUTROABS 3.7  --   --   HGB 11.6* 9.1* 10.6*  HCT 34.1* 27.1* 31.5*  MCV 90.9 90.6 91.0  PLT 224 137* 154   Cardiac Enzymes:  Recent Labs Lab  02/12/14 1344  CKTOTAL 263*    Signed:  GHERGHE, COSTIN  [facility] 02/16/2014, 8:52 AM

## 2014-02-16 NOTE — Evaluation (Signed)
Occupational Therapy Evaluation Patient Details Name: Rhona LeavensJohn Eccleston MRN: [id number] DOB: [birth year] Today's Date: 02/16/2014    History of Present Illness This is a 73 year old man who has a history of alcoholism and he presents to the emergency room after he had some sort of fall couple of days ago. He stayed on the floor for over 20 hours before he was able to get up. Apparently he has had difficulty with walking for the last 3 months as he loses his balance and feels weak. He apparently has not had alcohol for the last 2 days and evaluation in the emergency room was suggestive of alcohol withdrawal. He is now being admitted for further management. He lives in a motel.   Clinical Impression   Pt presenting to acute OT with above situation.  He has significant edema in B hands - educated pt on importance of elevation and provided towel rolls to help reduce edema.  Pt in significant pain with any movement and declined MMT or functional testing of BUE.  Pt will benefit from further skilled OT services - recommend SNF OT at this time.    Follow Up Recommendations  SNF    Equipment Recommendations   (Defer to SNF)    Recommendations for Other Services       Precautions / Restrictions Precautions Precautions: Fall Restrictions Weight Bearing Restrictions: No      Mobility Bed Mobility                  Transfers                      Balance                                            ADL Overall ADL's : Needs assistance/impaired Eating/Feeding: Maximal assistance Eating/Feeding Details (indicate cue type and reason): Pt declined to attempt grasping at this time Grooming: Maximal assistance                                 General ADL Comments: Pt currenlty presenting at max assist level with all ADLs due to increased pain, BUE swelling, and generalized weakness.     Vision                     Perception     Praxis       Pertinent Vitals/Pain      Hand Dominance Right   Extremity/Trunk Assessment Upper Extremity Assessment Upper Extremity Assessment: RUE deficits/detail;LUE deficits/detail RUE Deficits / Details: Pt with significanly increase swelling in hands. Pt declined testing of ROM/strength, and verbalized inability to participate in UE movements.  Pt assisted in raising shoudler into flexion when repositioning arm.. RUE: Unable to fully assess due to pain RUE Sensation: decreased light touch (hypersensitivity) RUE Coordination: decreased gross motor;decreased fine motor LUE Deficits / Details: Pt with significanly increase swelling in hands. Pt declined testing of ROM/strength, and verbalized inability to participate in UE movements. LUE: Unable to fully assess due to pain LUE Sensation: decreased light touch (hypersensitivity) LUE Coordination: decreased fine motor;decreased gross motor   Lower Extremity Assessment Lower Extremity Assessment: Defer to PT evaluation       Communication Communication Communication: No difficulties   Cognition Arousal/Alertness: Lethargic Behavior During Therapy: Restless  Area of Impairment: Orientation Orientation Level: Disoriented to;Time                 General Comments       Exercises       Shoulder Instructions      Home Living Family/patient expects to be discharged to:: Skilled nursing facility Living Arrangements: Alone                               Additional Comments: Pt lives alone in a motel; per case management pt will not be able to return to the motel.  DME : cane      Prior Functioning/Environment Level of Independence: Needs assistance    ADL's / Homemaking Assistance Needed: Pt reports that friends were delivering meals to his hotel room.  pt also reports that he was crawling to the bathroom        OT Diagnosis: Generalized weakness;Acute pain   OT Problem List: Decreased strength;Decreased  range of motion;Decreased activity tolerance;Impaired sensation;Impaired UE functional use;Increased edema;Pain;Decreased knowledge of precautions   OT Treatment/Interventions: Self-care/ADL training;Therapeutic exercise;Energy conservation;Modalities;Manual therapy;DME and/or AE instruction;Therapeutic activities;Patient/family education    OT Goals(Current goals can be found in the care plan section) Acute Rehab OT Goals Patient Stated Goal: none stated OT Goal Formulation: With patient Time For Goal Achievement: 03/02/14 Potential to Achieve Goals: Fair  OT Frequency: Min 2X/week   Barriers to D/C: Inaccessible home environment;Decreased caregiver support          Co-evaluation              End of Session    Activity Tolerance: Patient limited by pain Patient left: with bed alarm set;in bed;with call bell/phone within reach   Time: 1610-9604 OT Time Calculation (min): 23 min Charges:  OT General Charges $OT Visit: 1 Procedure OT Evaluation $Initial OT Evaluation Tier I: 1 Procedure G-Codes:     Marry Guan, MS, OTR/L [phone]  02/16/2014, 9:29 AM

## 2014-02-16 NOTE — Clinical Social Work Placement (Signed)
Clinical Social Work Department CLINICAL SOCIAL WORK PLACEMENT NOTE 02/16/2014  Patient:  Jacob LeavensMCCUISTON,Jacob  Account Number:  [id number] Admit date:  02/12/2014  Clinical Social Worker:  Derenda FennelKARA Michi Herrmann, LCSW  Date/time:  02/15/2014 01:19 PM  Clinical Social Work is seeking post-discharge placement for this patient at the following level of care:   SKILLED NURSING   (*CSW will update this form in Epic as items are completed)   02/15/2014  Patient/family provided with [facility] Department of Clinical Social Work's list of facilities offering this level of care within the geographic area requested by the patient (or if unable, by the patient's family).  02/15/2014  Patient/family informed of their freedom to choose among providers that offer the needed level of care, that participate in Medicare, Medicaid or managed care program needed by the patient, have an available bed and are willing to accept the patient.  02/15/2014  Patient/family informed of [facility]' ownership interest in [facility], as well as of the fact that they are under no obligation to receive care at this facility.  PASARR submitted to EDS on  PASARR number received on   FL2 transmitted to all facilities in geographic area requested by pt/family on  02/15/2014 FL2 transmitted to all facilities within larger geographic area on   Patient informed that his/her managed care company has contracts with or will negotiate with  certain facilities, including the following:     Patient/family informed of bed offers received:  02[facility]16/2014 [facility] [place] [facility] and [facility] [place]  [facility] be [facility] [place] on  02/16/2014 [facility] Patient and family notified of transfer on  Name of family member notified:  Pt's friend's number in chart not working- pt notified  The following  physician request were entered in Epic:   Additional Comments: Pt has existing Psychologist, forensicpasarr.  Derenda FennelKara Calypso Hagarty, [place]CS[place] [phone]

## 2014-02-17 NOTE — Progress Notes (Signed)
UR chart review completed.  

## 2014-02-18 ENCOUNTER — Emergency Department (HOSPITAL_COMMUNITY): Payer: Medicare Other

## 2014-02-18 ENCOUNTER — Encounter (HOSPITAL_COMMUNITY): Payer: Self-pay | Admitting: Emergency Medicine

## 2014-02-18 ENCOUNTER — Inpatient Hospital Stay (HOSPITAL_COMMUNITY)
Admission: EM | Admit: 2014-02-18 | Discharge: 2014-02-25 | DRG: 682 | Disposition: A | Discharge status: Skilled Nursing Facility | Payer: Medicare Other | Attending: Internal Medicine | Admitting: Internal Medicine

## 2014-02-18 DIAGNOSIS — IMO0002 Reserved for concepts with insufficient information to code with codable children: Secondary | ICD-10-CM | POA: Diagnosis not present

## 2014-02-18 DIAGNOSIS — E876 Hypokalemia: Secondary | ICD-10-CM | POA: Diagnosis present

## 2014-02-18 DIAGNOSIS — K8020 Calculus of gallbladder without cholecystitis without obstruction: Secondary | ICD-10-CM | POA: Diagnosis present

## 2014-02-18 DIAGNOSIS — F17200 Nicotine dependence, unspecified, uncomplicated: Secondary | ICD-10-CM | POA: Diagnosis present

## 2014-02-18 DIAGNOSIS — N139 Obstructive and reflux uropathy, unspecified: Secondary | ICD-10-CM | POA: Diagnosis present

## 2014-02-18 DIAGNOSIS — I472 Ventricular tachycardia, unspecified: Secondary | ICD-10-CM | POA: Diagnosis not present

## 2014-02-18 DIAGNOSIS — I82509 Chronic embolism and thrombosis of unspecified deep veins of unspecified lower extremity: Secondary | ICD-10-CM | POA: Diagnosis present

## 2014-02-18 DIAGNOSIS — M129 Arthropathy, unspecified: Secondary | ICD-10-CM | POA: Diagnosis present

## 2014-02-18 DIAGNOSIS — D61818 Other pancytopenia: Secondary | ICD-10-CM

## 2014-02-18 DIAGNOSIS — R6251 Failure to thrive (child): Secondary | ICD-10-CM | POA: Diagnosis present

## 2014-02-18 DIAGNOSIS — K5900 Constipation, unspecified: Secondary | ICD-10-CM | POA: Diagnosis present

## 2014-02-18 DIAGNOSIS — M1A00X0 Idiopathic chronic gout, unspecified site, without tophus (tophi): Secondary | ICD-10-CM | POA: Diagnosis present

## 2014-02-18 DIAGNOSIS — D500 Iron deficiency anemia secondary to blood loss (chronic): Secondary | ICD-10-CM | POA: Diagnosis present

## 2014-02-18 DIAGNOSIS — M2540 Effusion, unspecified joint: Secondary | ICD-10-CM | POA: Diagnosis present

## 2014-02-18 DIAGNOSIS — R5381 Other malaise: Secondary | ICD-10-CM | POA: Diagnosis present

## 2014-02-18 DIAGNOSIS — R339 Retention of urine, unspecified: Secondary | ICD-10-CM | POA: Diagnosis present

## 2014-02-18 DIAGNOSIS — R531 Weakness: Secondary | ICD-10-CM

## 2014-02-18 DIAGNOSIS — D649 Anemia, unspecified: Secondary | ICD-10-CM

## 2014-02-18 DIAGNOSIS — R310 Gross hematuria: Secondary | ICD-10-CM | POA: Diagnosis present

## 2014-02-18 DIAGNOSIS — I10 Essential (primary) hypertension: Secondary | ICD-10-CM | POA: Diagnosis present

## 2014-02-18 DIAGNOSIS — R5383 Other fatigue: Secondary | ICD-10-CM | POA: Diagnosis present

## 2014-02-18 DIAGNOSIS — M109 Gout, unspecified: Secondary | ICD-10-CM | POA: Diagnosis present

## 2014-02-18 DIAGNOSIS — N179 Acute kidney failure, unspecified: Secondary | ICD-10-CM | POA: Diagnosis present

## 2014-02-18 DIAGNOSIS — M1A9XX0 Chronic gout, unspecified, without tophus (tophi): Secondary | ICD-10-CM | POA: Diagnosis present

## 2014-02-18 DIAGNOSIS — F1010 Alcohol abuse, uncomplicated: Secondary | ICD-10-CM | POA: Diagnosis present

## 2014-02-18 DIAGNOSIS — D72819 Decreased white blood cell count, unspecified: Secondary | ICD-10-CM | POA: Diagnosis present

## 2014-02-18 DIAGNOSIS — R29898 Other symptoms and signs involving the musculoskeletal system: Secondary | ICD-10-CM

## 2014-02-18 DIAGNOSIS — F1020 Alcohol dependence, uncomplicated: Secondary | ICD-10-CM | POA: Diagnosis present

## 2014-02-18 DIAGNOSIS — E8809 Other disorders of plasma-protein metabolism, not elsewhere classified: Secondary | ICD-10-CM | POA: Diagnosis present

## 2014-02-18 DIAGNOSIS — L03314 Cellulitis of groin: Secondary | ICD-10-CM

## 2014-02-18 DIAGNOSIS — D62 Acute posthemorrhagic anemia: Secondary | ICD-10-CM | POA: Diagnosis present

## 2014-02-18 DIAGNOSIS — G9340 Encephalopathy, unspecified: Secondary | ICD-10-CM | POA: Diagnosis present

## 2014-02-18 DIAGNOSIS — I4729 Other ventricular tachycardia: Secondary | ICD-10-CM | POA: Diagnosis not present

## 2014-02-18 DIAGNOSIS — R627 Adult failure to thrive: Secondary | ICD-10-CM | POA: Diagnosis present

## 2014-02-18 DIAGNOSIS — I825Z1 Chronic embolism and thrombosis of unspecified deep veins of right distal lower extremity: Secondary | ICD-10-CM

## 2014-02-18 DIAGNOSIS — N390 Urinary tract infection, site not specified: Secondary | ICD-10-CM | POA: Diagnosis present

## 2014-02-18 DIAGNOSIS — E43 Unspecified severe protein-calorie malnutrition: Secondary | ICD-10-CM | POA: Diagnosis present

## 2014-02-18 DIAGNOSIS — E872 Acidosis, unspecified: Secondary | ICD-10-CM | POA: Diagnosis present

## 2014-02-18 DIAGNOSIS — M10012 Idiopathic gout, left shoulder: Secondary | ICD-10-CM

## 2014-02-18 DIAGNOSIS — I825Z9 Chronic embolism and thrombosis of unspecified deep veins of unspecified distal lower extremity: Secondary | ICD-10-CM | POA: Diagnosis present

## 2014-02-18 DIAGNOSIS — R609 Edema, unspecified: Secondary | ICD-10-CM

## 2014-02-18 HISTORY — DX: Chronic embolism and thrombosis of unspecified deep veins of unspecified distal lower extremity: I82.5Z9

## 2014-02-18 HISTORY — DX: Gout, unspecified: M10.9

## 2014-02-18 HISTORY — DX: Calculus of gallbladder without cholecystitis without obstruction: K80.20

## 2014-02-18 HISTORY — DX: Retention of urine, unspecified: R33.9

## 2014-02-18 HISTORY — DX: Acute posthemorrhagic anemia: D62

## 2014-02-18 HISTORY — DX: Alcohol abuse, uncomplicated: F10.10

## 2014-02-18 HISTORY — DX: Gross hematuria: R31.0

## 2014-02-18 MED ORDER — FUROSEMIDE 40 MG PO TABS
40.0000 mg | ORAL_TABLET | Freq: Once | ORAL | Status: AC
  Administered 2014-02-18: 40 mg via ORAL
  Filled 2014-02-18: qty 1

## 2014-02-18 NOTE — ED Notes (Addendum)
Pt from [facility], pt there x 2 days now. Came to ED due to increased WBC and BNP and swelling. Pt alert/oriented to most. Staff noticed swelling to abd and hands. Swelling with no pitting noted to lower legs/feet/hands and lower arms. abd swelling but soft and nontender. Slight sob noted.

## 2014-02-18 NOTE — ED Notes (Addendum)
RN from [facility] called. Stated did not feel comfortable sending pt back to [facility] due to swelling and pt can not move arms and stated it would be hard to manage pt that way. EDP aware. EDP also aware of hypotension

## 2014-02-18 NOTE — H&P (Signed)
[facility] History and Physical  Jacob Riddle ZOX:[id number] DOB: Feb 28, 1941 DOA: 02/18/2014   PCP: Catalina Pizza, MD  Specialists: Unknown  Chief Complaint: Swelling of Ankles and other joints  HPI: Jacob Riddle is a 73 y.o. male with a past medical history of alcohol dependence, who was sent over from skilled nursing facility with the above complaints. Patient was discharged from [facility] 2 days ago after being managed for alcohol withdrawal syndrome. He was noted to have bilateral lower extremity weakness, which was attributed to failure to thrive. He underwent MRI of his thoracic and lumbar spine, which revealed non specific findings. These findings were discussed with the neurosurgeon at that time and not felt to contribute to his symptoms. Patient is a very poor historian. He likely has cognitive impairment due to his alcoholism. Not much history was available from him. He did tell me that he had generalized weakness and that he was hurting all over. Denies any falls or injuries in the last 48-72 hours. History is very limited. Skilled nursing facility was reluctant to take him back due to the swelling and his poor functional status.  Home Medications: Prior to Admission medications   Medication Sig Start Date End Date Taking? Authorizing Provider  acetaminophen (TYLENOL) 325 MG tablet Take 2 tablets (650 mg total) by mouth every 8 (eight) hours as needed for mild pain. 02/16/14   Costin Otelia Sergeant, MD  amLODipine (NORVASC) 5 MG tablet Take 1 tablet (5 mg total) by mouth daily. 02/16/14   Costin Otelia Sergeant, MD  feeding supplement, ENSURE COMPLETE, (ENSURE COMPLETE) LIQD Take 237 mLs by mouth 2 (two) times daily between meals. 02/16/14   Costin Otelia Sergeant, MD  HYDROcodone-acetaminophen (NORCO/VICODIN) 5-325 MG per tablet Take 1 tablet by mouth every 6 (six) hours as needed for moderate pain. 02/16/14   Costin Otelia Sergeant, MD  Multiple Vitamin (MULTIVITAMIN) capsule Take 1 capsule by mouth daily.  02/16/14   Costin Otelia Sergeant, MD    Allergies:  Allergies  Allergen Reactions  . Lisinopril Other (See Comments)    Dizziness    Past Medical History: Past Medical History  Diagnosis Date  . Arthritis   . Gout   . Hypertension   . Fracture     left arm    History reviewed. No pertinent past surgical history.  Social History: Patient is currently in a skilled nursing facility. Rest of the social history is unobtainable. He does have a significant history of alcohol and crack abuse.  Family History: Unable to provide any family medical history  Review of Systems - unable to to review of system due to his cognitive impairment  Physical Examination  Filed Vitals:   02/18/14 2100 02/18/14 2130 02/18/14 2200 02/18/14 2230  BP: 79/48 92/71 109/75 90/60  Pulse: 81 88 89 86  Resp: 25 23 24 25   SpO2: 98% 98% 98% 98%    BP 90/60  Pulse 86  Resp 25  SpO2 98%  General appearance: alert, appears older than stated age, distracted and no distress Head: Normocephalic, without obvious abnormality, atraumatic Eyes: conjunctivae/corneas clear. PERRL, EOM's intact.  Throat: lips, mucosa, and tongue normal; teeth and gums normal Neck: no adenopathy, no carotid bruit, no JVD, supple, symmetrical, trachea midline and thyroid not enlarged, symmetric, no tenderness/mass/nodules Resp: clear to auscultation bilaterally Cardio: regular rate and rhythm, S1, S2 normal, no murmur, click, rub or gallop GI: Is soft, or distention of the urinary bladder palpated in the lower abdomen with tenderness. No rebound, rigidity,  or guarding. No other masses. Bowel sounds present. Extremities: No pitting pedal edema. Swelling of his ankles, and feet were noted. Pulses: 2+ and symmetric Skin: Skin color, texture, turgor normal. No rashes or lesions Lymph nodes: Cervical, supraclavicular, and axillary nodes normal. Neurologic: He is alert. Able to move his arms and legs to some extent, but not able to lift  his legs off the bed. Movement of any joint causes pain. Examination is limited. No focal deficits identified.  Laboratory Data: Blood work was done earlier today at the skilled nursing facility, which showed creatinine of 2.5, with elevated BUN in the 40s. His WBC was 15. Platelet count, normal. He had normocytic anemia with hemoglobin of 10. Bicarbonate was low. LFTs were mildly abnormal.  Radiology Reports: Dg Chest Portable 1 View  02/18/2014   CLINICAL DATA:  Shortness of breath/edema.  Smoker.  EXAM: PORTABLE CHEST - 1 VIEW  COMPARISON:  02/12/2014  FINDINGS: Lungs are adequately inflated without consolidation or effusion. Old left humeral neck fracture. The cardiomediastinal silhouette and remainder of the exam is unchanged.  IMPRESSION: No active disease.   Electronically Signed   By: Elberta Fortisaniel  Boyle M.D.   On: 02/18/2014 17:00    Electrocardiogram: Sinus tachycardia. 109 beats per minute. Normal axis. QT prolongation is noted. No Q waves. No concerning ST or T-wave changes are noted.  Problem List  Principal Problem:   ARF (acute renal failure) Active Problems:   Alcohol abuse   Metabolic acidosis   Weakness generalized   Failure to thrive in childhood   Severe protein-calorie malnutrition   Bilateral leg weakness   Normocytic anemia   Assessment: This is a 73 year old, Caucasian male, who was sent over from the Skilled Nursing facility for swelling of joints. There was a concern that the patient might have had congestive heart failure. However, he does not really have pedal pitting edema. Swelling appears to be limited to joints. Chest x-ray was unremarkable. He has generalized weakness. He has known alcohol dependency. Blood work from the SNF showed acute renal failure.  Plan: #1 acute renal failure: Patient was given Lasix in the emergency department due to concern about pedal edema. However, I think he is intravascularly depleted. We will give him IV fluids. He appears to have  a distended urinary bladder on examination. Foley catheter will have to be placed as he appears to be experiencing urinary retention. We will get an abdominal ultrasound. Urine tests will be obtained once Urine is available. We will check urine electrolytes. If renal function does not improve, nephrology input may be required. Blood pressure was noted to be borderline low. Patient is asymptomatic. Continue to monitor.  #2 metabolic acidosis: Most likely due to renal failure. Repeat labs in the morning.  #3 joint swelling, with generalized weakness, and body aches: Etiology is unclear. Check uric acid. Will check B12, folate. Anemia panel. Check TSH. We will also get an echocardiogram. PT and OT will be consulted. Check venous Dopplers.  #4 history of alcohol abuse with, mildly abnormal LFTs: Repeat LFTs in the morning. Check hepatitis panel. Give thiamine and multivitamins. Check magnesium level.  #5 severe protein calorie malnutrition: Continue with regular diet.   DVT Prophylaxis: Heparin Code Status: Full code Family Communication: No family at bedside  Disposition Plan: Admit to telemetry for now.   Further management decisions will depend on results of further testing and patient's response to treatment.   [facility] District HospitalKRISHNAN,Kentrel Clevenger  [facility] Pager [phone]  If 7PM-7AM, please contact night-coverage [email or web address]  Password [email or web address]  02/18/2014, 11:58 PM  Disclaimer: This note was dictated with voice recognition software. Similar sounding words can inadvertently be transcribed and may not be corrected upon review.

## 2014-02-18 NOTE — ED Provider Notes (Signed)
CSN: [id number]     Arrival date & time 02/18/14  1626 History  This chart was scribed for Jacob Hutching, MD by Milly Jakob, ED Scribe. The patient was seen in room [facility]03/[facility]03. Patient's care was started at 5:00 PM.     Chief Complaint  Patient presents with  . Edema  . Shortness of Breath   The history is provided by the patient. The history is limited by the condition of the patient. No language interpreter was used.   Level 5 Caveat: Confusion HPI Comments: Jacob Riddle is a 73 y.o. male who presents to the Emergency Department complaining of swelling in his feet. He reports that his SOB is better than usual today. He reports that he was "brought in by the police, and states that he believes the people at [facility] are trying to kill him".  Patient recently admitted to nursing home after being admitted to the [facility].  His biggest concern is the persistent edema in his legs and arms. No substernal chest pain, fever, chills. Labs were drawn today at the nursing home:  Hemoglobin was 10.5, white count 15.6, ammonia level 45, creatinine 2.26, BNP greater than 3000.    Past Medical History  Diagnosis Date  . Arthritis   . Gout   . Hypertension   . Fracture     left arm   History reviewed. No pertinent past surgical history. History reviewed. No pertinent family history. History  Substance Use Topics  . Smoking status: Current Every Day Smoker  . Smokeless tobacco: Not on file  . Alcohol Use: Yes     Comment: none since 02/12/2014    Review of Systems  Unable to perform ROS   Level 5 Caveat: Confusion   Allergies  Lisinopril  Home Medications   Prior to Admission medications   Medication Sig Start Date End Date Taking? Authorizing Provider  acetaminophen (TYLENOL) 325 MG tablet Take 2 tablets (650 mg total) by mouth every 8 (eight) hours as needed for mild pain. 02/16/14   Costin Otelia Sergeant, MD  amLODipine (NORVASC) 5 MG tablet Take 1 tablet (5 mg total) by  mouth daily. 02/16/14   Costin Otelia Sergeant, MD  feeding supplement, ENSURE COMPLETE, (ENSURE COMPLETE) LIQD Take 237 mLs by mouth 2 (two) times daily between meals. 02/16/14   Costin Otelia Sergeant, MD  HYDROcodone-acetaminophen (NORCO/VICODIN) 5-325 MG per tablet Take 1 tablet by mouth every 6 (six) hours as needed for moderate pain. 02/16/14   Costin Otelia Sergeant, MD  Multiple Vitamin (MULTIVITAMIN) capsule Take 1 capsule by mouth daily. 02/16/14   Costin Otelia Sergeant, MD   BP 90/60  Pulse 86  Resp 25  SpO2 98% Physical Exam  Nursing note and vitals reviewed. Constitutional: He is oriented to person, place, and time. He appears well-developed and well-nourished. No distress.  Not dyspneic. Rambling speech.  HENT:  Head: Normocephalic and atraumatic.  Eyes: Conjunctivae and EOM are normal.  Neck: Neck supple. No tracheal deviation present.  Cardiovascular: Normal rate.   Pulmonary/Chest: Effort normal. No respiratory distress.  Musculoskeletal: Normal range of motion.  Neurological: He is alert and oriented to person, place, and time.  Skin:  Significant lower extremity and upper extremity edema with questionable bluish tint to extremities  Psychiatric: He has a normal mood and affect. His behavior is normal.    ED Course  Procedures (including critical care time) DIAGNOSTIC STUDIES: Oxygen Saturation is 98% on room air which is normal  COORDINATION OF CARE:  5:05PM-Discussed treatment plan which includes Lasix with pt at bedside and pt agreed to plan.   Labs Review Results for orders placed during the hospital encounter of 02/12/14  CBC WITH DIFFERENTIAL      Result Value Ref Range   WBC 4.2  4.0 - 10.5 K/uL   RBC 3.75 (*) 4.22 - 5.81 MIL/uL   Hemoglobin 11.6 (*) 13.0 - 17.0 g/dL   HCT 16.1 (*) 09.6 - 04.5 %   MCV 90.9  78.0 - 100.0 fL   MCH 30.9  26.0 - 34.0 pg   MCHC 34.0  30.0 - 36.0 g/dL   RDW 40.9 (*) 81.1 - 91.4 %   Platelets 224  150 - 400 K/uL   Neutrophils Relative % 88 (*) 43  - 77 %   Neutro Abs 3.7  1.7 - 7.7 K/uL   Lymphocytes Relative 9 (*) 12 - 46 %   Lymphs Abs 0.4 (*) 0.7 - 4.0 K/uL   Monocytes Relative 3  3 - 12 %   Monocytes Absolute 0.1  0.1 - 1.0 K/uL   Eosinophils Relative 0  0 - 5 %   Eosinophils Absolute 0.0  0.0 - 0.7 K/uL   Basophils Relative 0  0 - 1 %   Basophils Absolute 0.0  0.0 - 0.1 K/uL  COMPREHENSIVE METABOLIC PANEL      Result Value Ref Range   Sodium 130 (*) 137 - 147 mEq/L   Potassium 3.6 (*) 3.7 - 5.3 mEq/L   Chloride 91 (*) 96 - 112 mEq/L   CO2 19  19 - 32 mEq/L   Glucose, Bld 156 (*) 70 - 99 mg/dL   BUN 30 (*) 6 - 23 mg/dL   Creatinine, Ser 7.82  0.50 - 1.35 mg/dL   Calcium 9.4  8.4 - 95.6 mg/dL   Total Protein 7.1  6.0 - 8.3 g/dL   Albumin 2.3 (*) 3.5 - 5.2 g/dL   AST 50 (*) 0 - 37 U/L   ALT 11  0 - 53 U/L   Alkaline Phosphatase 78  39 - 117 U/L   Total Bilirubin 0.9  0.3 - 1.2 mg/dL   GFR calc non Af Amer 89 (*) >90 mL/min   GFR calc Af Amer >90  >90 mL/min   Anion gap 20 (*) 5 - 15  URINALYSIS, ROUTINE W REFLEX MICROSCOPIC      Result Value Ref Range   Color, Urine YELLOW  YELLOW   APPearance CLEAR  CLEAR   Specific Gravity, Urine 1.020  1.005 - 1.030   pH 5.5  5.0 - 8.0   Glucose, UA NEGATIVE  NEGATIVE mg/dL   Hgb urine dipstick SMALL (*) NEGATIVE   Bilirubin Urine SMALL (*) NEGATIVE   Ketones, ur 15 (*) NEGATIVE mg/dL   Protein, ur 213 (*) NEGATIVE mg/dL   Urobilinogen, UA 4.0 (*) 0.0 - 1.0 mg/dL   Nitrite NEGATIVE  NEGATIVE   Leukocytes, UA NEGATIVE  NEGATIVE  ETHANOL      Result Value Ref Range   Alcohol, Ethyl (B) <11  0 - 11 mg/dL  CK      Result Value Ref Range   Total CK 263 (*) 7 - 232 U/L  URINE RAPID DRUG SCREEN (HOSP PERFORMED)      Result Value Ref Range   Opiates NONE DETECTED  NONE DETECTED   Cocaine NONE DETECTED  NONE DETECTED   Benzodiazepines NONE DETECTED  NONE DETECTED   Amphetamines NONE DETECTED  NONE DETECTED  Tetrahydrocannabinol NONE DETECTED  NONE DETECTED   Barbiturates  NONE DETECTED  NONE DETECTED  MAGNESIUM      Result Value Ref Range   Magnesium 1.5  1.5 - 2.5 mg/dL  APTT      Result Value Ref Range   aPTT 32  24 - 37 seconds  PROTIME-INR      Result Value Ref Range   Prothrombin Time 13.8  11.6 - 15.2 seconds   INR 1.06  0.00 - 1.49  URINE MICROSCOPIC-ADD ON      Result Value Ref Range   Squamous Epithelial / LPF RARE  RARE   WBC, UA 0-2  <3 WBC/hpf   RBC / HPF 3-6  <3 RBC/hpf   Bacteria, UA FEW (*) RARE  COMPREHENSIVE METABOLIC PANEL      Result Value Ref Range   Sodium 134 (*) 137 - 147 mEq/L   Potassium 2.9 (*) 3.7 - 5.3 mEq/L   Chloride 99  96 - 112 mEq/L   CO2 19  19 - 32 mEq/L   Glucose, Bld 110 (*) 70 - 99 mg/dL   BUN 19  6 - 23 mg/dL   Creatinine, Ser 0.450.65  0.50 - 1.35 mg/dL   Calcium 8.2 (*) 8.4 - 10.5 mg/dL   Total Protein 5.8 (*) 6.0 - 8.3 g/dL   Albumin 1.8 (*) 3.5 - 5.2 g/dL   AST 46 (*) 0 - 37 U/L   ALT 10  0 - 53 U/L   Alkaline Phosphatase 64  39 - 117 U/L   Total Bilirubin 0.6  0.3 - 1.2 mg/dL   GFR calc non Af Amer >90  >90 mL/min   GFR calc Af Amer >90  >90 mL/min   Anion gap 16 (*) 5 - 15  CBC      Result Value Ref Range   WBC 2.5 (*) 4.0 - 10.5 K/uL   RBC 2.99 (*) 4.22 - 5.81 MIL/uL   Hemoglobin 9.1 (*) 13.0 - 17.0 g/dL   HCT 40.927.1 (*) 81.139.0 - 91.452.0 %   MCV 90.6  78.0 - 100.0 fL   MCH 30.4  26.0 - 34.0 pg   MCHC 33.6  30.0 - 36.0 g/dL   RDW 78.218.9 (*) 95.611.5 - 21.315.5 %   Platelets 137 (*) 150 - 400 K/uL  MAGNESIUM      Result Value Ref Range   Magnesium 1.3 (*) 1.5 - 2.5 mg/dL  MAGNESIUM      Result Value Ref Range   Magnesium 1.9  1.5 - 2.5 mg/dL  PHOSPHORUS      Result Value Ref Range   Phosphorus 4.2  2.3 - 4.6 mg/dL  BASIC METABOLIC PANEL      Result Value Ref Range   Sodium 137  137 - 147 mEq/L   Potassium 3.2 (*) 3.7 - 5.3 mEq/L   Chloride 101  96 - 112 mEq/L   CO2 19  19 - 32 mEq/L   Glucose, Bld 117 (*) 70 - 99 mg/dL   BUN 17  6 - 23 mg/dL   Creatinine, Ser 0.860.73  0.50 - 1.35 mg/dL   Calcium 8.6   8.4 - 57.810.5 mg/dL   GFR calc non Af Amer >90  >90 mL/min   GFR calc Af Amer >90  >90 mL/min   Anion gap 17 (*) 5 - 15  CBC      Result Value Ref Range   WBC 2.4 (*) 4.0 - 10.5 K/uL   RBC 3.46 (*) 4.22 -  5.81 MIL/uL   Hemoglobin 10.6 (*) 13.0 - 17.0 g/dL   HCT 16.1 (*) 09.6 - 04.5 %   MCV 91.0  78.0 - 100.0 fL   MCH 30.6  26.0 - 34.0 pg   MCHC 33.7  30.0 - 36.0 g/dL   RDW 40.9 (*) 81.1 - 91.4 %   Platelets 154  150 - 400 K/uL  BASIC METABOLIC PANEL      Result Value Ref Range   Sodium 138  137 - 147 mEq/L   Potassium 4.1  3.7 - 5.3 mEq/L   Chloride 107  96 - 112 mEq/L   CO2 15 (*) 19 - 32 mEq/L   Glucose, Bld 118 (*) 70 - 99 mg/dL   BUN 20  6 - 23 mg/dL   Creatinine, Ser 7.82  0.50 - 1.35 mg/dL   Calcium 8.7  8.4 - 95.6 mg/dL   GFR calc non Af Amer 69 (*) >90 mL/min   GFR calc Af Amer 80 (*) >90 mL/min   Anion gap 16 (*) 5 - 15  MAGNESIUM      Result Value Ref Range   Magnesium 1.7  1.5 - 2.5 mg/dL  PHOSPHORUS      Result Value Ref Range   Phosphorus 3.6  2.3 - 4.6 mg/dL  BASIC METABOLIC PANEL      Result Value Ref Range   Sodium 136 (*) 137 - 147 mEq/L   Potassium 3.9  3.7 - 5.3 mEq/L   Chloride 103  96 - 112 mEq/L   CO2 18 (*) 19 - 32 mEq/L   Glucose, Bld 124 (*) 70 - 99 mg/dL   BUN 26 (*) 6 - 23 mg/dL   Creatinine, Ser 2.13  0.50 - 1.35 mg/dL   Calcium 9.0  8.4 - 08.6 mg/dL   GFR calc non Af Amer 54 (*) >90 mL/min   GFR calc Af Amer 63 (*) >90 mL/min   Anion gap 15  5 - 15     Imaging Review Dg Chest Portable 1 View  02/18/2014   CLINICAL DATA:  Shortness of breath/edema.  Smoker.  EXAM: PORTABLE CHEST - 1 VIEW  COMPARISON:  02/12/2014  FINDINGS: Lungs are adequately inflated without consolidation or effusion. Old left humeral neck fracture. The cardiomediastinal silhouette and remainder of the exam is unchanged.  IMPRESSION: No active disease.   Electronically Signed   By: Elberta Fortis M.D.   On: 02/18/2014 17:00     EKG Interpretation   Date/Time:   Thursday February 18 2014 16:39:49 EDT Ventricular Rate:  109 PR Interval:  132 QRS Duration: 86 QT Interval:  377 QTC Calculation: 508 R Axis:   94 Text Interpretation:  Sinus tachycardia Atrial premature complex  Anteroseptal infarct, age indeterminate Prolonged QT interval Confirmed by  Adriana Simas  MD, Nakhi Choi ([id number]) on 02/18/2014 5:48:49 PM      MDM   Final diagnoses:  Edema   Patient is clearly edematous. Chest x-ray does not show any obvious failure. BNP greater than 3000. Minimal response to IV Lasix.  Dr Rito Ehrlich will evaluate patient.  I personally performed the services described in this documentation, which was scribed in my presence. The recorded information has been reviewed and is accurate.      Jacob Hutching, MD 02/18/14 [phone]

## 2014-02-19 ENCOUNTER — Inpatient Hospital Stay (HOSPITAL_COMMUNITY): Payer: Medicare Other

## 2014-02-19 ENCOUNTER — Encounter (HOSPITAL_COMMUNITY): Payer: Self-pay | Admitting: Urology

## 2014-02-19 DIAGNOSIS — R310 Gross hematuria: Secondary | ICD-10-CM

## 2014-02-19 DIAGNOSIS — R609 Edema, unspecified: Secondary | ICD-10-CM

## 2014-02-19 LAB — HEPATITIS PANEL, ACUTE
HCV AB: REACTIVE — AB
HEP A IGM: NONREACTIVE
Hep B C IgM: NONREACTIVE
Hepatitis B Surface Ag: NEGATIVE

## 2014-02-19 LAB — PROTIME-INR
INR: 1.12 (ref 0.00–1.49)
Prothrombin Time: 14.4 seconds (ref 11.6–15.2)

## 2014-02-19 LAB — FOLATE: FOLATE: 4.7 ng/mL

## 2014-02-19 LAB — CBC
HEMATOCRIT: 25.5 % — AB (ref 39.0–52.0)
Hemoglobin: 8.5 g/dL — ABNORMAL LOW (ref 13.0–17.0)
MCH: 30.5 pg (ref 26.0–34.0)
MCHC: 33.3 g/dL (ref 30.0–36.0)
MCV: 91.4 fL (ref 78.0–100.0)
Platelets: 332 10*3/uL (ref 150–400)
RBC: 2.79 MIL/uL — ABNORMAL LOW (ref 4.22–5.81)
RDW: 19.6 % — AB (ref 11.5–15.5)
WBC: 7.6 10*3/uL (ref 4.0–10.5)

## 2014-02-19 LAB — COMPREHENSIVE METABOLIC PANEL
ALBUMIN: 1.7 g/dL — AB (ref 3.5–5.2)
ALT: 13 U/L (ref 0–53)
AST: 35 U/L (ref 0–37)
Alkaline Phosphatase: 142 U/L — ABNORMAL HIGH (ref 39–117)
Anion gap: 18 — ABNORMAL HIGH (ref 5–15)
BILIRUBIN TOTAL: 0.7 mg/dL (ref 0.3–1.2)
BUN: 56 mg/dL — ABNORMAL HIGH (ref 6–23)
CHLORIDE: 100 meq/L (ref 96–112)
CO2: 17 mEq/L — ABNORMAL LOW (ref 19–32)
Calcium: 8.9 mg/dL (ref 8.4–10.5)
Creatinine, Ser: 2.1 mg/dL — ABNORMAL HIGH (ref 0.50–1.35)
GFR calc Af Amer: 35 mL/min — ABNORMAL LOW (ref >90)
GFR, EST NON AFRICAN AMERICAN: 30 mL/min — AB (ref >90)
Glucose, Bld: 104 mg/dL — ABNORMAL HIGH (ref 70–99)
Potassium: 3.9 mEq/L (ref 3.7–5.3)
SODIUM: 135 meq/L — AB (ref 137–147)
Total Protein: 6.3 g/dL (ref 6.0–8.3)

## 2014-02-19 LAB — RETICULOCYTES
RBC.: 2.79 MIL/uL — ABNORMAL LOW (ref 4.22–5.81)
RETIC COUNT ABSOLUTE: 47.4 10*3/uL (ref 19.0–186.0)
Retic Ct Pct: 1.7 % (ref 0.4–3.1)

## 2014-02-19 LAB — URINALYSIS, ROUTINE W REFLEX MICROSCOPIC
BILIRUBIN URINE: NEGATIVE
GLUCOSE, UA: NEGATIVE mg/dL
Nitrite: NEGATIVE
Protein, ur: 30 mg/dL — AB
SPECIFIC GRAVITY, URINE: 1.01 (ref 1.005–1.030)
Urobilinogen, UA: 0.2 mg/dL (ref 0.0–1.0)
pH: 5.5 (ref 5.0–8.0)

## 2014-02-19 LAB — APTT: aPTT: 36 seconds (ref 24–37)

## 2014-02-19 LAB — TSH: TSH: 2.87 u[IU]/mL (ref 0.350–4.500)

## 2014-02-19 LAB — PROTEIN / CREATININE RATIO, URINE
Creatinine, Urine: 13.83 mg/dL
Protein Creatinine Ratio: 23.72 — ABNORMAL HIGH (ref 0.00–0.15)
Total Protein, Urine: 328 mg/dL

## 2014-02-19 LAB — URINE MICROSCOPIC-ADD ON

## 2014-02-19 LAB — CREATININE, URINE, RANDOM: Creatinine, Urine: 59.4 mg/dL

## 2014-02-19 LAB — MAGNESIUM: MAGNESIUM: 1.7 mg/dL (ref 1.5–2.5)

## 2014-02-19 LAB — IRON AND TIBC
Iron: 18 ug/dL — ABNORMAL LOW (ref 42–135)
Saturation Ratios: 11 % — ABNORMAL LOW (ref 20–55)
TIBC: 161 ug/dL — ABNORMAL LOW (ref 215–435)
UIBC: 143 ug/dL (ref 125–400)

## 2014-02-19 LAB — SODIUM, URINE, RANDOM: Sodium, Ur: 52 mEq/L

## 2014-02-19 LAB — CK: CK TOTAL: 153 U/L (ref 7–232)

## 2014-02-19 LAB — VITAMIN B12: Vitamin B-12: 369 pg/mL (ref 211–911)

## 2014-02-19 LAB — FERRITIN: Ferritin: 644 ng/mL — ABNORMAL HIGH (ref 22–322)

## 2014-02-19 LAB — URIC ACID: URIC ACID, SERUM: 9.7 mg/dL — AB (ref 4.0–7.8)

## 2014-02-19 MED ORDER — ACETAMINOPHEN 325 MG PO TABS
650.0000 mg | ORAL_TABLET | Freq: Four times a day (QID) | ORAL | Status: DC | PRN
  Administered 2014-02-20: 650 mg via ORAL
  Filled 2014-02-19 (×3): qty 2

## 2014-02-19 MED ORDER — ONDANSETRON HCL 4 MG/2ML IJ SOLN: 4.0000 mg | Freq: Four times a day (QID) | INTRAMUSCULAR | Status: DC | PRN

## 2014-02-19 MED ORDER — SODIUM CHLORIDE 0.9 % IV SOLN: INTRAVENOUS | Status: AC

## 2014-02-19 MED ORDER — ACETAMINOPHEN 650 MG RE SUPP: 650.0000 mg | Freq: Four times a day (QID) | RECTAL | Status: DC | PRN

## 2014-02-19 MED ORDER — ONDANSETRON HCL 4 MG PO TABS: 4.0000 mg | ORAL_TABLET | Freq: Four times a day (QID) | ORAL | Status: DC | PRN

## 2014-02-19 MED ORDER — ADULT MULTIVITAMIN W/MINERALS CH
1.0000 | ORAL_TABLET | Freq: Every day | ORAL | Status: DC
  Administered 2014-02-19 – 2014-02-25 (×6): 1 via ORAL
  Filled 2014-02-19 (×7): qty 1

## 2014-02-19 MED ORDER — DEXTROSE 5 % IV SOLN
1.0000 g | INTRAVENOUS | Status: DC
  Administered 2014-02-19 – 2014-02-25 (×7): 1 g via INTRAVENOUS
  Filled 2014-02-19 (×8): qty 10

## 2014-02-19 MED ORDER — VITAMIN B-1 100 MG PO TABS
100.0000 mg | ORAL_TABLET | Freq: Every day | ORAL | Status: DC
  Administered 2014-02-19 – 2014-02-25 (×7): 100 mg via ORAL
  Filled 2014-02-19 (×7): qty 1

## 2014-02-19 MED ORDER — HEPARIN SODIUM (PORCINE) 5000 UNIT/ML IJ SOLN: 5000.0000 [IU] | Freq: Three times a day (TID) | INTRAMUSCULAR | Status: DC

## 2014-02-19 MED ORDER — ALBUTEROL SULFATE (2.5 MG/3ML) 0.083% IN NEBU: 2.5000 mg | INHALATION_SOLUTION | RESPIRATORY_TRACT | Status: DC | PRN

## 2014-02-19 MED ORDER — SODIUM CHLORIDE 0.9 % IV SOLN
INTRAVENOUS | Status: AC
  Administered 2014-02-19: 19:00:00 via INTRAVENOUS

## 2014-02-19 MED ORDER — SODIUM CHLORIDE 0.9 % IJ SOLN
3.0000 mL | Freq: Two times a day (BID) | INTRAMUSCULAR | Status: DC
  Administered 2014-02-19 – 2014-02-25 (×11): 3 mL via INTRAVENOUS

## 2014-02-19 NOTE — Evaluation (Signed)
Occupational Therapy Evaluation Patient Details Name: Jacob Riddle MRN: [id number] DOB: [birth year] Today's Date: 02/19/2014    History of Present Illness Jacob Riddle is a 73 y.o. male with a past medical history of alcohol dependence, who was sent over from skilled nursing facility with the above complaints. Patient was discharged from [facility] 2 days ago after being managed for alcohol withdrawal syndrome. He was noted to have bilateral lower extremity weakness, which was attributed to failure to thrive. He underwent MRI of his thoracic and lumbar spine, which revealed non specific findings. These findings were discussed with the neurosurgeon at that time and not felt to contribute to his symptoms. Patient is a very poor historian. He likely has cognitive impairment due to his alcoholism. Not much history was available from him. He did tell me that he had generalized weakness and that he was hurting all over. Denies any falls or injuries in the last 48-72 hours. History is very limited. Skilled nursing facility was reluctant to take him back due to the swelling and his poor functional status.   Clinical Impression   Pt is presenting to acute OT with above situation.  Pt remains with continued increased pain and edema in BUE from previous OT eval, but was more tolerant of evaluation of UE ROM.  Pt continues to need OT skilled services and will benefit from SNF placement.    Follow Up Recommendations  SNF    Equipment Recommendations   (Defer to SNF)    Recommendations for Other Services       Precautions / Restrictions Precautions Precautions: Fall Restrictions Weight Bearing Restrictions: No      Mobility Bed Mobility  Transfers                    Balance Overall balance assessment: Needs assistance                                  ADL Overall ADL's : Needs assistance/impaired Eating/Feeding: Maximal assistance Eating/Feeding Details (indicate cue type  and reason): Pt declined to attempt grasping due to increased pain Grooming: Maximal assistance                                 General ADL Comments: Pt currenlty presenting at max assist level with all ADLs due to increased pain, BUE swelling, and generalized weakness.     Vision                     Perception     Praxis      Pertinent Vitals/Pain 8/10 pain     Hand Dominance Right   Extremity/Trunk Assessment Upper Extremity Assessment Upper Extremity Assessment: Generalized weakness;RUE deficits/detail;LUE deficits/detail RUE Deficits / Details: Pt able to tolerate increased assessment of RUE this session. Had minimal shoulder flexion, but can tolerate PROM to 90 degrees. Pt has 25% AROM digit flexion/extension, supination to neutral, 25% range wrist flexion/extension, and minimal range elbow flexion (increased pain at IV site). Pt remains with increased swelling in hand, and has increased tenderness with all tactile stimulation. RUE: Unable to fully assess due to pain RUE Sensation:  (hypersensitivity) RUE Coordination: decreased fine motor;decreased gross motor LUE Deficits / Details: Pt had minimal tolerance of LUE assessment.  during repositiong, demonstrated minimal shoulder flexion.  Remains with incresed edema in hand and hypersensitivity LUE: Unable  to fully assess due to pain LUE Sensation:  (hypersensitivity) LUE Coordination: decreased fine motor;decreased gross motor   Lower Extremity Assessment Lower Extremity Assessment: Defer to PT evaluation RLE: Unable to fully assess due to pain LLE: Unable to fully assess due to pain   Cervical / Trunk Assessment Cervical / Trunk Assessment: Kyphotic   Communication Communication Communication: No difficulties   Cognition Arousal/Alertness: Lethargic Behavior During Therapy: Agitated;Restless Overall Cognitive Status: No family/caregiver present to determine baseline cognitive functioning Area  of Impairment: Orientation Orientation Level: Disoriented to;Time                 General Comments       Exercises       Shoulder Instructions      Home Living Family/patient expects to be discharged to:: Skilled nursing facility Living Arrangements: Alone                               Additional Comments: Pt lives alone in a motel; per case management pt will not be able to return to the motel.  DME : cane      Prior Functioning/Environment Level of Independence: Needs assistance  Gait / Transfers Assistance Needed: Help required, but patient unable to specify ADL's / Homemaking Assistance Needed: Pt reports that friends were delivering meals to his hotel room.  pt also reports that he was crawling to the bathroom   Comments: Pt reports he was mod (I) with bed mobility skills, transfers, and amb with use of std cane in the Rt hand.      OT Diagnosis: Generalized weakness;Acute pain   OT Problem List: Decreased strength;Decreased range of motion;Decreased activity tolerance;Impaired sensation;Impaired UE functional use;Increased edema;Pain;Decreased knowledge of precautions   OT Treatment/Interventions: Self-care/ADL training;Therapeutic exercise;Energy conservation;Modalities;Manual therapy;DME and/or AE instruction;Therapeutic activities;Patient/family education    OT Goals(Current goals can be found in the care plan section) Acute Rehab OT Goals Patient Stated Goal: none stated OT Goal Formulation: With patient Time For Goal Achievement: 03/05/14 Potential to Achieve Goals: Fair  OT Frequency: Min 2X/week   Barriers to D/C: Inaccessible home environment;Decreased caregiver support          Co-evaluation              End of Session    Activity Tolerance: Patient limited by pain Patient left: with bed alarm set;in bed;with call bell/phone within reach   Time: 0920-0940 OT Time Calculation (min): 20 min Charges:  OT General Charges $OT  Visit: 1 Procedure OT Evaluation $Initial OT Evaluation Tier I: 1 Procedure G-Codes:     Marry GuanMarie Rawlings, MS, OTR/L [phone]  02/19/2014, 9:50 AM

## 2014-02-19 NOTE — Progress Notes (Signed)
UR chart review completed.  

## 2014-02-19 NOTE — Progress Notes (Signed)
PROGRESS NOTE  Jacob LeavensJohn Riddle [id number] DOB: May 11, 1941 DOA: 02/18/2014 PCP: Catalina PizzaHALL, ZACH, MD  HPI: Jacob Riddle is a 73 y.o. male with a past medical history of alcohol dependence, who was sent over from skilled nursing facility with the above complaints. Patient was discharged from [facility] 2 days ago after being managed for alcohol withdrawal syndrome. He was noted to have bilateral lower extremity weakness, which was attributed to failure to thrive. He underwent MRI of his thoracic and lumbar spine, which revealed non specific findings. These findings were discussed with the neurosurgeon at that time and not felt to contribute to his symptoms. Patient is a very poor historian. He likely has cognitive impairment due to his alcoholism. Not much history was available from him. He did tell me that he had generalized weakness and that he was hurting all over. Denies any falls or injuries in the last 48-72 hours. History is very limited. Skilled nursing facility was reluctant to take him back due to the swelling and his poor functional status.  Subjective: - does not want to talk to me.   Assessment/Plan: Acute renal failure - suspect component of poor po intake, also obstructive component with urinary retention, appreciate urology input. Plan for CT scan once renal function improves.  Metabolic acidosis - due to #1 Joint swelling - patient would not let me examine his joints. Will try again tomorrow. Cannot do NSAIDs given renal failure, if this is gout will try Prednisone.  Alcohol abuse - abstinent now but with sequelae  Severe protein calorie malnutrition UTI - ceftriaxone.  Generalized weakness - PT consult.  Diet: regular  Fluids: NS DVT Prophylaxis: SCD  Code Status: Full Family Communication: d/w patient  Disposition Plan: inpatient  Consultants:  Urology   Procedures:  None    Antibiotics Ceftriaxone 7/16 >>  Objective: Filed Vitals:   02/18/14 2200 02/18/14 2230  02/19/14 0006 02/19/14 0037  BP: 109/75 90/60 113/86 157/79  Pulse: 89 86 90 103  Temp:    97.9 F (36.6 C)  TempSrc:    Oral  Resp: 24 25 20 20   Height:    6' (1.829 m)  Weight:    76.522 kg (168 lb 11.2 oz)  SpO2: 98% 98% 100% 100%    Intake/Output Summary (Last 24 hours) at 02/19/14 1426 Last data filed at 02/19/14 1200  Gross per 24 hour  Intake    240 ml  Output   1400 ml  Net  -1160 ml   Filed Weights   02/19/14 0037  Weight: 76.522 kg (168 lb 11.2 oz)    Exam:  Patient refused physical exam today.  Data Reviewed: Basic Metabolic Panel:  Recent Labs Lab 02/13/14 0623 02/13/14 0626 02/14/14 0537 02/15/14 0626 02/16/14 0557 02/19/14 0613  NA  --  134* 137 138 136* 135*  K  --  2.9* 3.2* 4.1 3.9 3.9  CL  --  99 101 107 103 100  CO2  --  19 19 15* 18* 17*  GLUCOSE  --  110* 117* 118* 124* 104*  BUN  --  19 17 20  26* 56*  CREATININE  --  0.65 0.73 1.05 1.29 2.10*  CALCIUM  --  8.2* 8.6 8.7 9.0 8.9  MG 1.3*  --  1.9 1.7  --  1.7  PHOS  --   --  4.2 3.6  --   --    Liver Function Tests:  Recent Labs Lab 02/13/14 0626 02/19/14 0613  AST 46* 35  ALT  10 13  ALKPHOS 64 142*  BILITOT 0.6 0.7  PROT 5.8* 6.3  ALBUMIN 1.8* 1.7*   No results found for this basename: LIPASE, AMYLASE,  in the last 168 hours No results found for this basename: AMMONIA,  in the last 168 hours CBC:  Recent Labs Lab 02/13/14 0626 02/14/14 0537 02/19/14 0613  WBC 2.5* 2.4* 7.6  HGB 9.1* 10.6* 8.5*  HCT 27.1* 31.5* 25.5*  MCV 90.6 91.0 91.4  PLT 137* 154 332   Cardiac Enzymes:  Recent Labs Lab 02/19/14 0851  CKTOTAL 153   BNP (last 3 results) No results found for this basename: PROBNP,  in the last 8760 hours CBG: No results found for this basename: GLUCAP,  in the last 168 hours  No results found for this or any previous visit (from the past 240 hour(s)).   Studies: Dg Chest Portable 1 View  02/18/2014   CLINICAL DATA:  Shortness of breath/edema.  Smoker.   EXAM: PORTABLE CHEST - 1 VIEW  COMPARISON:  02/12/2014  FINDINGS: Lungs are adequately inflated without consolidation or effusion. Old left humeral neck fracture. The cardiomediastinal silhouette and remainder of the exam is unchanged.  IMPRESSION: No active disease.   Electronically Signed   By: Elberta Fortis M.D.   On: 02/18/2014 17:00    Scheduled Meds: . cefTRIAXone (ROCEPHIN)  IV  1 g Intravenous Q24H  . multivitamin with minerals  1 tablet Oral Daily  . sodium chloride  3 mL Intravenous Q12H  . thiamine  100 mg Oral Daily   Continuous Infusions:   Principal Problem:   ARF (acute renal failure) Active Problems:   Alcohol abuse   Metabolic acidosis   Weakness generalized   Failure to thrive in childhood   Severe protein-calorie malnutrition   Bilateral leg weakness   Normocytic anemia  Time spent: 15  This note has been created with Education officer, environmental. Any transcriptional errors are unintentional.   Pamella Pert, MD [facility] Pager [phone]. If 7 PM - 7 AM, please contact night-coverage at [email or web address], password [facility] 02/19/2014, 2:26 PM  LOS: 1 day

## 2014-02-19 NOTE — Care Management Note (Addendum)
    Page 1 of 1   02/25/2014     11:35:09 AM CARE MANAGEMENT NOTE 02/25/2014  Patient:  Rhona LeavensMCCUISTON,Gabriel   Account Number:  [id number]  Date Initiated:  02/19/2014  Documentation initiated by:  Sharrie RothmanBLACKWELL,Shakaria Raphael C  Subjective/Objective Assessment:   Pt admitted from [facility] with UTI, renal failure. Pt does not want to return to [facility] at discharge.     Action/Plan:   CSW is aware that pt will need placement at a different facility at discharge.   Anticipated DC Date:  02/22/2014   Anticipated DC Plan:  SKILLED NURSING FACILITY  In-house referral  Clinical Social Worker      DC Planning Services  CM consult      Choice offered to / List presented to:             Status of service:  Completed, signed off Medicare Important Message given?  YES (If response is "NO", the following Medicare IM given date fields will be blank) Date Medicare IM given:  02/19/2014 Medicare IM given by:  Arlyss QueenBLACKWELL,Antario Yasuda C Date Additional Medicare IM given:  02/25/2014 Additional Medicare IM given by:  Sharrie RothmanAMMY C Alba Perillo  Discharge Disposition:  SKILLED NURSING FACILITY  Per UR Regulation:    If discussed at Long Length of Stay Meetings, dates discussed:   02/23/2014  02/25/2014    Comments:  02/25/14 1130 Arlyss Queenammy Ife Vitelli, RN BSN CM [facility]. CSW to arrange discharge to facility.  02/19/14 1045 Arlyss Queenammy Ingram Onnen, Charity fundraiserN BSN CM

## 2014-02-19 NOTE — Evaluation (Signed)
Physical Therapy Evaluation Patient Details Name: Jacob Riddle MRN: [id number] DOB: 02-Sep-1940 Today's Date: 02/19/2014   History of Present Illness  This is a 73 year old man who has a history of alcoholism and he presents to the emergency room after he had some sort of fall several of days ago. He stayed on the floor for over 20 hours before he was able to get up. Apparently he has had difficulty with walking for the last 3 months as he loses his balance and feels weak. He apparently has not had alcohol for the last 5 days and evaluation in the emergency room was suggestive of alcohol withdrawal. He is now being admitted for further management. He lives in a motel.  Clinical Impression  Patient displays severe pain and weakness in bilateral UE and LE  resulting in patient unable to complete bed mobility and LE exercises without Max assistance. Recommending patient discharge to SNF secondary to inaccessible home environment due to limited ability of support and inability to perform ADLs. Physical therapy will continue to see patient during acute care stay. Patient educated on importance of sitting up and out of bed activities but denied to perform exercises and activities following sitting at edge of bed for 5 minutes as patient stated he wanted to rest..     Follow Up Recommendations SNF          Precautions / Restrictions Precautions Precautions: Fall Restrictions Weight Bearing Restrictions: No      Mobility  Bed Mobility Overal bed mobility: Needs Assistance Bed Mobility: Supine to Sit;Sit to Supine     Supine to sit: Max assist Sit to supine: Max assist   General bed mobility comments: patient unable to initiate movemtn, when given cuign patient able and max asssit patient is able to obtain and maintain, sittign with min asssist  Transfers                 General transfer comment: unable to assess due to fatigue  Ambulation/Gait             General Gait  Details: Pt refused despite encouragment and education on importance of OOB activity to increase strength and functional mobility.        Balance Overall balance assessment: Needs assistance Sitting-balance support: Feet supported;Single extremity supported Sitting balance-Leahy Scale: Poor Sitting balance - Comments: dizziness secondary to orthostatic hypotension, improves only mildly with sitting up for Postural control: Posterior lean                                   Pertinent Vitals/Pain 8/10 in bilateral UE and legs (Lt side > Rt)    Home Living Family/patient expects to be discharged to:: Skilled nursing facility Living Arrangements: Alone               Additional Comments: Pt lives alone in a motel; per case management pt will not be able to return to the motel.  DME : cane    Prior Function Level of Independence: Needs assistance   Gait / Transfers Assistance Needed: Help required, but patient unable to specify  ADL's / Homemaking Assistance Needed: Pt reports that friends were delivering meals to his hotel room.  pt also reports that he was crawling to the bathroom  Comments: Pt reports he was mod (I) with bed mobility skills, transfers, and amb with use of std cane in the Rt hand.  Hand Dominance   Dominant Hand: Right    Extremity/Trunk Assessment   Upper Extremity Assessment: Generalized weakness RUE Deficits / Details: Pt with significanly increase swelling in hands. Pt declined testing of ROM/strength, and verbalized inability to participate in Lt UE movements.  Pt assisted in raising shoudler into flexion when repositioning arm..         Lower Extremity Assessment: Generalized weakness      Cervical / Trunk Assessment: Kyphotic  Communication   Communication: No difficulties  Cognition Arousal/Alertness: Lethargic Behavior During Therapy: Restless Overall Cognitive Status: No family/caregiver present to determine  baseline cognitive functioning Area of Impairment: Orientation Orientation Level: Disoriented to;Time                  General Comments      Exercises        Assessment/Plan    PT Assessment Patient needs continued PT services  PT Diagnosis Difficulty walking;Generalized weakness;Acute pain   PT Problem List Decreased strength;Decreased activity tolerance;Decreased safety awareness;Decreased balance;Decreased mobility;Pain;Decreased cognition;Decreased coordination  PT Treatment Interventions Balance training;Gait training;Neuromuscular re-education;Functional mobility training;Therapeutic activities;Therapeutic exercise;Patient/family education   PT Goals (Current goals can be found in the Care Plan section)      Frequency Min 3X/week   Barriers to discharge Inaccessible home environment;Decreased caregiver support         End of Session   Activity Tolerance: Patient limited by pain;Patient limited by fatigue Patient left: in bed;with call bell/phone within reach;with bed alarm set           Time: [id number] PT Time Calculation (min): 32 min   Charges:   PT Evaluation $Initial PT Evaluation Tier I: 1 Procedure PT Treatments $Therapeutic Activity: 8-22 mins        Lynzy Rawles R 02/19/2014, 9:08 AM

## 2014-02-19 NOTE — Clinical Social Work Placement (Signed)
Clinical Social Work Department CLINICAL SOCIAL WORK PLACEMENT NOTE 02/19/2014  Patient:  Jacob Riddle,Jacob Riddle  Account Number:  [id number] Admit date:  02/18/2014  Clinical Social Worker:  Derenda FennelKARA Trevonn Hallum, LCSW  Date/time:  02/19/2014 08:45 AM  Clinical Social Work is seeking post-discharge placement for this patient at the following level of care:   SKILLED NURSING   (*CSW will update this form in Epic as items are completed)   02/19/2014  Patient/family provided with [facility] Department of Clinical Social Work's list of facilities offering this level of care within the geographic area requested by the patient (or if unable, by the patient's family).  02/19/2014  Patient/family informed of their freedom to choose among providers that offer the needed level of care, that participate in Medicare, Medicaid or managed care program needed by the patient, have an available bed and are willing to accept the patient.  02/19/2014  Patient/family informed of [facility]' ownership interest in [facility], as well as of the fact that they are under no obligation to receive care at this facility.  PASARR submitted to EDS on  PASARR number received on   FL2 transmitted to all facilities in geographic area requested by pt/family on  02/19/2014 FL2 transmitted to all facilities within larger geographic area on   Patient informed that his/her managed care company has contracts with or will negotiate with  certain facilities, including the following:     Patient/family informed of bed offers received:  02/19/2014 [facility] Physician recommends and patient chooses bed at  [facility]  Patient to be transferred to  on   Patient to be transferred to facility by  Patient and family notified of transfer on  Name of family member notified:    The following physician request were entered in Epic:   Additional Comments: Pt has existing  pasarr.  Derenda FennelKara Rehana Uncapher, Kentucky[place]CS[place] [phone]

## 2014-02-19 NOTE — Clinical Social Work Note (Signed)
CSW presented bed offers and pt chooses [facility]. Facility notified. Awaiting stability for d/c.  Derenda FennelKara Dina Mobley, LCSW [phone]

## 2014-02-19 NOTE — Clinical Social Work Psychosocial (Signed)
Clinical Social Work Department BRIEF PSYCHOSOCIAL ASSESSMENT 02/19/2014  Patient:  Jacob Riddle, Jacob Riddle     Account Number:  [id number]     Admit date:  02/18/2014  Clinical Social Worker:  Wyatt Haste  Date/Time:  02/19/2014 08:47 AM  Referred by:  CSW  Date Referred:  02/19/2014 Referred for  SNF Placement   Other Referral:   Interview type:  Patient Other interview type:    PSYCHOSOCIAL DATA Living Status:  FACILITY Admitted from facility:  [place] Level of care:  [place] Primary support name:   Primary support relationship to patient:  FRIEND Degree of support available:   limited    CURRENT CONCERNS Current Concerns  Post-Acute Placement   Other Concerns:    SOCIAL WORK ASSESSMENT / PLAN CSW met with pt at bedside. Pt known to CSW from admission last week. Pt d/c to [facility] for rehab. He is alert and oriented. Pt has very limited support. Prior to last admission, pt was living in a hotel. He was unable to get out of bed and very weak. CSW asked pt about SNF. He reports he does not like it [facility] and is not going back, but did not go into any details. CSW discussed his d/c plan and he is aware he needs to go to a facility. Reminded pt that he had a bed offer at [facility] in [place] and he states he will not go there either. Agreeable to placement in [place] or [place] counties. Per Jackelyn Poling at [facility], pt was unable to work with therapy as he would cry out in pain if he was touched.   Assessment/plan status:  Psychosocial Support/Ongoing Assessment of Needs Other assessment/ plan:   Information/referral to community resources:   SNF list    PATIENT'S/FAMILY'S RESPONSE TO PLAN OF CARE: Pt requests new placement. CSW will initiate bed search and follow up with bed offers when available.       Benay Pike, [place]

## 2014-02-19 NOTE — Progress Notes (Addendum)
Late Entry:  1620 US report called and positive for thrombus in right lower extremity.  Dr. Elvera LennoxGherghe notified.  No new orders at this time.  Per Dr. Elvera LennoxGherghe patient not candidate for anticoagulation.  Dr. Elvera LennoxGherghe asked about having a guardian appointed for patient.  Informed that SW begins the process.  Ann, SW on unit and notified.

## 2014-02-19 NOTE — Consult Note (Signed)
Subjective: Mr. Jacob Riddle is a 73 yo WM who I was asked to see in consultation by Dr. Elvera LennoxGherghe for gross hematuria.   The [facility].   He had a foley placed on admission and now has gross hematuria.   His UA is suggestive of infection but a culture is pending.   A UA on 7/10 had only 3-6 RBC's.   Prior to admission he reports no dysuria or frequency.   He had a good stream and was emptying well.   He has no prior stones or GU surgery.   He may have had prostatitis in the past and he thinks he [place] prostate [place] several years ago but I couldn't find any records in our office notes.   He does report malodorous urine for about 3 months but his UA's prior to admission didn't suggest infection.   He has been started on Rocephin.   A lumbar MRI showed no renal abnormalities.   ROS:  Review of Systems  Constitutional: Positive for chills, weight loss and malaise/fatigue. Negative for fever.  HENT: Negative.   Eyes: Negative.   Respiratory: Positive for cough. Negative for shortness of breath and wheezing.   Cardiovascular: Positive for leg swelling. Negative for chest pain.  Gastrointestinal: Negative for heartburn, nausea, vomiting, abdominal pain, diarrhea, constipation and blood in stool.  Genitourinary: Positive for urgency, frequency and hematuria. Negative for dysuria and flank pain.  Musculoskeletal: Positive for back pain and joint pain.  Skin:       He reports skin slough on his feet  Neurological: Positive for weakness. Negative for dizziness, tremors, focal weakness and seizures.  Endo/Heme/Allergies: Bruises/bleeds easily.  Psychiatric/Behavioral: Negative for depression. The patient is not nervous/anxious.     Allergies  Allergen Reactions  . Lisinopril Other (See Comments)    Dizziness    Past Medical History  Diagnosis Date  . Arthritis   . Gout   . Hypertension   . Fracture     left arm    History  reviewed. No pertinent past surgical history.  History   Social History  . Marital Status: Single    Spouse Name: N/A    Number of Children: N/A  . Years of Education: N/A   Occupational History  . Not on file.   Social History Main Topics  . Smoking status: Current Every Day Smoker  . Smokeless tobacco: Not on file  . Alcohol Use: Yes     Comment: none since 02/12/2014  . Drug Use: No  . Sexual Activity: Not on file   Other Topics Concern  . Not on file   Social History Narrative  . No narrative on file    History reviewed. No pertinent family history.  Anti-infectives: Anti-infectives   Start     Dose/Rate Route Frequency Ordered Stop   02/19/14 0800  cefTRIAXone (ROCEPHIN) 1 g in dextrose 5 % 50 mL IVPB     1 g 100 mL/hr over 30 Minutes Intravenous Every 24 hours 02/19/14 0630        Current Facility-Administered Medications  Medication Dose Route Frequency Provider Last Rate Last Dose  . 0.9 %  sodium chloride infusion   Intravenous Continuous Osvaldo ShipperGokul Krishnan, MD      . acetaminophen (TYLENOL) tablet 650 mg  650 mg Oral Q6H PRN Osvaldo ShipperGokul Krishnan, MD       Or  . acetaminophen (TYLENOL) suppository 650 mg  650 mg Rectal Q6H PRN Osvaldo Shipper, MD      . albuterol (PROVENTIL) (2.5 MG/3ML) 0.083% nebulizer solution 2.5 mg  2.5 mg Nebulization Q2H PRN Osvaldo Shipper, MD      . cefTRIAXone (ROCEPHIN) 1 g in dextrose 5 % 50 mL IVPB  1 g Intravenous Q24H Osvaldo Shipper, MD   1 g at 02/19/14 1101  . multivitamin with minerals tablet 1 tablet  1 tablet Oral Daily Osvaldo Shipper, MD   1 tablet at 02/19/14 1102  . ondansetron (ZOFRAN) tablet 4 mg  4 mg Oral Q6H PRN Osvaldo Shipper, MD       Or  . ondansetron [facility]) injection 4 mg  4 mg Intravenous Q6H PRN Osvaldo Shipper, MD      . sodium chloride 0.9 % injection 3 mL  3 mL Intravenous Q12H Osvaldo Shipper, MD      . thiamine (VITAMIN B-1) tablet 100 mg  100 mg Oral Daily Osvaldo Shipper, MD   100 mg at 02/19/14 1102      Objective: Vital signs in last 24 hours: Temp:  [97.9 F (36.6 C)] 97.9 F (36.6 C) (07/17 0037) Pulse Rate:  [81-103] 103 (07/17 0037) Resp:  [15-30] 20 (07/17 0037) BP: (79-157)/(44-86) 157/79 mmHg (07/17 0037) SpO2:  [97 %-100 %] 100 % (07/17 0037) Weight:  [76.522 kg (168 lb 11.2 oz)] 76.522 kg (168 lb 11.2 oz) (07/17 0037)  Intake/Output from previous day: 07/16 0701 - 07/17 0700 In: -  Out: 1400 [Urine:1400] Intake/Output this shift: Total I/O In: 120 [P.O.:120] Out: -    Physical Exam  Constitutional: He is well-developed, well-nourished, and in no distress.  HENT:  Head: Normocephalic and atraumatic.  Neck: Normal range of motion. Neck supple. No thyromegaly present.  Bilateral carotid bruits  Cardiovascular: Normal rate and regular rhythm.   No murmur heard. Pulmonary/Chest: Effort normal and breath sounds normal. No respiratory distress.  Abdominal: Soft. Bowel sounds are normal. He exhibits no mass. There is tenderness (diffuse mild). There is guarding (voluntary).  Genitourinary: Penis normal.  Foley indwelling draining blood tinged urine.   Scrotum and testes are normal.   He could not roll on his side for a prostate exam.    Musculoskeletal: Normal range of motion. He exhibits edema and tenderness (in his feet).  Neurological: He is alert.  Oriented to person only  Skin: Skin is warm and dry.    Lab Results:   Recent Labs  02/19/14 0613  WBC 7.6  HGB 8.5*  HCT 25.5*  PLT 332   BMET  Recent Labs  02/19/14 0613  NA 135*  K 3.9  CL 100  CO2 17*  GLUCOSE 104*  BUN 56*  CREATININE 2.10*  CALCIUM 8.9   PT/INR  Recent Labs  02/19/14 0613  LABPROT 14.4  INR 1.12   ABG No results found for this basename: PHART, PCO2, PO2, HCO3,  in the last 72 hours  Studies/Results: Dg Chest Portable 1 View  02/18/2014   CLINICAL DATA:  Shortness of breath/edema.  Smoker.  EXAM: PORTABLE CHEST - 1 VIEW  COMPARISON:  02/12/2014  FINDINGS: Lungs  are adequately inflated without consolidation or effusion. Old left humeral neck fracture. The cardiomediastinal silhouette and remainder of the exam is unchanged.  IMPRESSION: No active disease.   Electronically Signed   By: Elberta Fortis M.D.   On: 02/18/2014 17:00   I have reviewed his recent labs, chart notes and x-ray reports as well as the MRI films.  The kidneys are only partially visualized but what is seen is normal.  Assessment: Gross hematuria possibly from a UTI, but he has had micro hematuria on prior UA's.  Plan: He is going to need a CT AP with and without IV contrast but this should wait until his renal function improves.    He will need cystoscopy but also not until we make sure the infection if present has cleared and after the CT to make sure he doesn't need retrogrades or ureteroscopy.  If he is still in the hospital next Tuesday, I will have Dr. Retta Diones see him, but if he is discharged, he will need f/u in our office after the CT.   CC: CC: Dr. Elvera Lennox.     LOS: 1 day    Janith Nielson,Nuno J 02/19/2014

## 2014-02-19 NOTE — Clinical Social Work Note (Signed)
MD concerned that patient cannot make decisions for himself, CSW [place] [facility], spoke w Minerva AreolaGrayson Weatherman, on call for APS w DSS.  Gave protective service report, asked that DSS begin process of investigating to determine whether petitioning for guardianship is appropriate.  Per supervisor, Holley BoucheZ Brooks, supervisor can sign patient into SNF when ready if DSS has petitioned for guardianship.  Erich MontaneChris Dudley, admissions from [facility], visited patient and confirmed to CSW that facility can accept patient when medically ready.  Santa GeneraAnne Cunningham, LCSW Clinical Social Worker [phone])

## 2014-02-19 NOTE — Clinical Social Work Placement (Signed)
Clinical Social Work Department CLINICAL SOCIAL WORK PLACEMENT NOTE 02/19/2014  Patient:  Rhona LeavensMCCUISTON,Tavone  Account Number:  [id number] Admit date:  02/18/2014  Clinical Social Worker:  Derenda FennelKARA Doral Ventrella, LCSW  Date/time:  02/19/2014 08:45 AM  Clinical Social Work is seeking post-discharge placement for this patient at the following level of care:   SKILLED NURSING   (*CSW will update this form in Epic as items are completed)   02/19/2014  Patient/family provided with [facility] of Clinical Social Work's list of facilities offering this level of care within the geographic area requested by the patient (or if unable, by the patient's family).  02/19/2014  Patient/family informed of their freedom to choose among providers that offer the needed level of care, that participate in Medicare, Medicaid or managed care program needed by the patient, have an available bed and are willing to accept the patient.  02/19/2014  Patient/family informed of [facility]' ownership interest in [facility], as well as of the fact that they are under no obligation to receive care at this facility.  PASARR submitted to EDS on  PASARR number received on   FL2 transmitted to all facilities in geographic area requested by pt/family on  02/19/2014 FL2 transmitted to all facilities within larger geographic area on   Patient informed that his/her managed care company has contracts with or will negotiate with  certain facilities, including the following:     Patient/family informed of bed offers received:   Patient chooses bed at  Physician recommends and patient chooses bed at    Patient to be transferred to  on   Patient to be transferred to facility by  Patient and family notified of transfer on  Name of family member notified:    The following physician request were entered in Epic:   Additional Comments: Pt has existing pasarr.  Derenda FennelKara Ovid Witman, [place] [phone]

## 2014-02-20 LAB — URINE CULTURE
COLONY COUNT: NO GROWTH
Culture: NO GROWTH

## 2014-02-20 LAB — COMPREHENSIVE METABOLIC PANEL
ALK PHOS: 147 U/L — AB (ref 39–117)
ALT: 17 U/L (ref 0–53)
ANION GAP: 20 — AB (ref 5–15)
AST: 57 U/L — ABNORMAL HIGH (ref 0–37)
Albumin: 1.7 g/dL — ABNORMAL LOW (ref 3.5–5.2)
BILIRUBIN TOTAL: 0.4 mg/dL (ref 0.3–1.2)
BUN: 45 mg/dL — ABNORMAL HIGH (ref 6–23)
CO2: 16 meq/L — AB (ref 19–32)
Calcium: 8.1 mg/dL — ABNORMAL LOW (ref 8.4–10.5)
Chloride: 99 mEq/L (ref 96–112)
Creatinine, Ser: 1.42 mg/dL — ABNORMAL HIGH (ref 0.50–1.35)
GFR calc Af Amer: 56 mL/min — ABNORMAL LOW (ref >90)
GFR, EST NON AFRICAN AMERICAN: 48 mL/min — AB (ref >90)
Glucose, Bld: 100 mg/dL — ABNORMAL HIGH (ref 70–99)
POTASSIUM: 3 meq/L — AB (ref 3.7–5.3)
SODIUM: 135 meq/L — AB (ref 137–147)
TOTAL PROTEIN: 6.2 g/dL (ref 6.0–8.3)

## 2014-02-20 LAB — CBC
HEMATOCRIT: 23.7 % — AB (ref 39.0–52.0)
HEMOGLOBIN: 7.9 g/dL — AB (ref 13.0–17.0)
MCH: 30.5 pg (ref 26.0–34.0)
MCHC: 33.3 g/dL (ref 30.0–36.0)
MCV: 91.5 fL (ref 78.0–100.0)
Platelets: 300 10*3/uL (ref 150–400)
RBC: 2.59 MIL/uL — AB (ref 4.22–5.81)
RDW: 19.7 % — ABNORMAL HIGH (ref 11.5–15.5)
WBC: 5.4 10*3/uL (ref 4.0–10.5)

## 2014-02-20 MED ORDER — POTASSIUM CHLORIDE CRYS ER 20 MEQ PO TBCR
30.0000 meq | EXTENDED_RELEASE_TABLET | Freq: Two times a day (BID) | ORAL | Status: DC
Start: 2014-02-20 — End: 2014-02-21
  Administered 2014-02-20: 30 meq via ORAL
  Filled 2014-02-20 (×4): qty 1

## 2014-02-20 MED ORDER — LORAZEPAM 2 MG/ML IJ SOLN
1.0000 mg | Freq: Once | INTRAMUSCULAR | Status: AC
  Administered 2014-02-21: 1 mg via INTRAVENOUS
  Filled 2014-02-20: qty 1

## 2014-02-20 MED ORDER — HEPARIN SODIUM (PORCINE) 5000 UNIT/ML IJ SOLN
5000.0000 [IU] | Freq: Three times a day (TID) | INTRAMUSCULAR | Status: DC
  Administered 2014-02-20 – 2014-02-24 (×11): 5000 [IU] via SUBCUTANEOUS
  Filled 2014-02-20 (×11): qty 1

## 2014-02-20 NOTE — Progress Notes (Addendum)
PROGRESS NOTE  Jacob Riddle ZOX:[id number] DOB: 1941-03-21 DOA: 02/18/2014 PCP: Catalina Pizza, MD  HPI: Jacob Riddle is a 73 y.o. male with a past medical history of alcohol dependence, who was sent over from skilled nursing facility with the above complaints. Patient was discharged from [facility] 2 days ago after being managed for alcohol withdrawal syndrome. He was noted to have bilateral lower extremity weakness, which was attributed to failure to thrive. He underwent MRI of his thoracic and lumbar spine, which revealed non specific findings. These findings were discussed with the neurosurgeon at that time and not felt to contribute to his symptoms. Patient is a very poor historian. He likely has cognitive impairment due to his alcoholism. Not much history was available from him. He did tell me that he had generalized weakness and that he was hurting all over. Denies any falls or injuries in the last 48-72 hours. History is very limited. Skilled nursing facility was reluctant to take him back due to the swelling and his poor functional status.  Subjective: - feeling better today  Assessment/Plan: Acute renal failure - suspect component of poor po intake, also obstructive component with urinary retention, appreciate urology input. Plan for CT scan once renal function improves.  - renal function improving today, he is eating better, will hold fluids. DVT right lower extremity - based on the venous ultrasound. Patient continues to have significant hematuria and hemoglobin with mild drop today. He is currently not a candidate for anticoagulation. I discussed his case with Dr. Fredia Sorrow from interventional radiology and he personally reviewed the findings. 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 The risk for PE with these clots are extremely small and he feels like there is no strong indication to a filter unless patient has a pulmonary embolism. His renal function will not allow a CTPA  and will order a VQ scan to evaluate.  - start heparin sq prophylaxis Metabolic acidosis - due to #1 RLE swelling - due to #2 Alcohol abuse - abstinent now but with sequelae. Acute encephalopathy - likely has underlying cognitive problems from long standing ETOH abuse. 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 Today 7/18 he is much better, knows he is at [facility] and knows year is 68. Discussed with him about his DVT and seemed to understand however is having a hard time relating back to me his medical problems. As his mental status shows some improvement it will be difficult to say to what extent he has capacity to make medical decisions and will need a thorough reassessment once his acute medical issues improve and prior to discharge.  Severe protein calorie malnutrition - encourage po intake.  UTI - ceftriaxone, urine cultures pending. Urine still pink but beginning to look better  Generalized weakness - PT consult.  Diet: regular  Fluids: NS DVT Prophylaxis: heparin  Code Status: Full Family Communication: d/w patient  Disposition Plan: inpatient, SNF when ready   Consultants:  Urology   Procedures:  None    Antibiotics Ceftriaxone 7/16 >>  Objective: Filed Vitals:   02/19/14 0037 02/19/14 1651 02/19/14 2316 02/20/14 0455  BP: 157/79 174/49 133/50 170/49  Pulse: 103 96 101 105  Temp: 97.9 F (36.6 C) 98.3 F (36.8 C) 97.8 F (36.6 C) 97.8 F (36.6 C)  TempSrc: Oral Oral Oral Oral  Resp: 20 20 20 20   Height: 6' (1.829 m)  Weight: 76.522 kg (168 lb 11.2 oz)     SpO2: 100% 100% 100% 99%    Intake/Output Summary (Last 24 hours) at 02/20/14 1138 Last data filed at 02/20/14 0458  Gross per 24 hour  Intake 263.33 ml  Output   1475 ml  Net -1211.67 ml   Filed Weights   02/19/14 0037  Weight: 76.522 kg (168 lb 11.2 oz)    Exam:  GEN: NAD, mildly tremulous  CV: RRR  Lungs: moves  air well  Ext: upper extremity edema, R leg swelling   Skin: no rashes  Data Reviewed: Basic Metabolic Panel:  Recent Labs Lab 02/14/14 0537 02/15/14 0626 02/16/14 0557 02/19/14 0613 02/20/14 0642  NA 137 138 136* 135* 135*  K 3.2* 4.1 3.9 3.9 3.0*  CL 101 107 103 100 99  CO2 19 15* 18* 17* 16*  GLUCOSE 117* 118* 124* 104* 100*  BUN 17 20 26* 56* 45*  CREATININE 0.73 1.05 1.29 2.10* 1.42*  CALCIUM 8.6 8.7 9.0 8.9 8.1*  MG 1.9 1.7  --  1.7  --   PHOS 4.2 3.6  --   --   --    Liver Function Tests:  Recent Labs Lab 02/19/14 0613 02/20/14 0642  AST 35 57*  ALT 13 17  ALKPHOS 142* 147*  BILITOT 0.7 0.4  PROT 6.3 6.2  ALBUMIN 1.7* 1.7*   CBC:  Recent Labs Lab 02/14/14 0537 02/19/14 0613 02/20/14 0642  WBC 2.4* 7.6 5.4  HGB 10.6* 8.5* 7.9*  HCT 31.5* 25.5* 23.7*  MCV 91.0 91.4 91.5  PLT 154 332 300   Cardiac Enzymes:  Recent Labs Lab 02/19/14 0851  CKTOTAL 153   Studies: [place] Abdomen Complete  02/19/2014   CLINICAL DATA:  Abnormal liver function tests, acute renal failure  EXAM: ULTRASOUND ABDOMEN COMPLETE  COMPARISON:  None.  FINDINGS: Gallbladder:  Dependent sludge, clot, or other debris is noted within the gallbladder. Gallbladder wall thickening measuring 7 mm is noted. No reported sonographic Murphy's side. No pericholecystic fluid.  Common bile duct:  Diameter: 3 mm  Liver:  Increased echogenicity with nodular contour but no focal abnormality or intrahepatic ductal dilatation.  IVC:  Obscured by overlying bowel gas and increased hepatic echogenicity  Pancreas:  Visualized portion unremarkable.  Spleen:  Size and appearance within normal limits.  Right Kidney:  Length: 12.5 cm. Cortical thinning and increased echogenicity without hydronephrosis or focal mass.  Left Kidney:  Length: 11.8 cm. Cortical thinning and increased echogenicity without hydronephrosis or focal mass.  Abdominal aorta:  No aneurysm visualized.  Other findings:  None.  IMPRESSION:  Dependent sludge, clot, or other debris within the gallbladder with gallbladder wall thickening. This could indicate acute cholecystitis in the appropriate clinical context, with metastatic or primary neoplastic mass significantly less likely in the absence of underlying known malignancy.  Bilateral renal cortical thinning and increased echogenicity, compatible with medical renal disease.   Electronically Signed   By: Christiana PellantGretchen  Green M.D.   On: 02/19/2014 15:38   [place] Pelvis Limited  02/19/2014   CLINICAL DATA:  Bladder distention, acute renal failure  EXAM: US PELVIS LIMITED  TECHNIQUE: Ultrasound examination of the pelvic soft tissues was performed in the area of clinical concern.  COMPARISON:  No recent similar comparison  FINDINGS: A Foley catheter is in place and the bladder is decompressed. The mucosa appears redundant and a small mass could be obscured by lack of distention by urine.  IMPRESSION: Bladder decompression with probable mucosal infolding.  Electronically Signed   By: Christiana Pellant M.D.   On: 02/19/2014 15:42   US Venous Img Lower Bilateral  02/19/2014   CLINICAL DATA:  Bilateral lower extremity pain and swelling. History of smoking. Evaluate for DVT.  EXAM: BILATERAL LOWER EXTREMITY VENOUS DOPPLER ULTRASOUND  TECHNIQUE: Gray-scale sonography with graded compression, as well as color Doppler and duplex ultrasound were performed to evaluate the lower extremity deep venous systems from the level of the common femoral vein and including the common femoral, femoral, profunda femoral, popliteal and calf veins including the posterior tibial, peroneal and gastrocnemius veins when visible. The superficial great saphenous vein was also interrogated. Spectral Doppler was utilized to evaluate flow at rest and with distal augmentation maneuvers in the common femoral, femoral and popliteal veins.  COMPARISON:  None.  FINDINGS: The examination is degraded due to patient body habitus and poor sonographic  window.  RIGHT LOWER EXTREMITY  The right common and deep femoral veins are widely patent. The right greater saphenous vein is patent proximally.  There is mixed echogenic occlusive thrombus throughout the right femoral vein extending through the right popliteal vein and into the imaged portions of the tibial veins.  LEFT LOWER EXTREMITY  The left common, superficial femoral, deep femoral and popliteal veins are widely patent. The tibial veins are not well visualized.  Other Findings:  None.  IMPRESSION: 1. Examination is positive for age-indeterminate occlusive thrombus throughout the right femoral and popliteal veins extending into the imaged portions of the right tibial veins. 2. No definite evidence of DVT within the left lower extremity, though note, the tibial veins of the left lower leg are not well visualized. These results will be called to the ordering clinician or representative by the Radiologist Assistant, and communication documented in the PACS or zVision Dashboard.   Electronically Signed   By: Simonne Come M.D.   On: 02/19/2014 16:00   Dg Chest Portable 1 View  02/18/2014   CLINICAL DATA:  Shortness of breath/edema.  Smoker.  EXAM: PORTABLE CHEST - 1 VIEW  COMPARISON:  02/12/2014  FINDINGS: Lungs are adequately inflated without consolidation or effusion. Old left humeral neck fracture. The cardiomediastinal silhouette and remainder of the exam is unchanged.  IMPRESSION: No active disease.   Electronically Signed   By: Elberta Fortis M.D.   On: 02/18/2014 17:00    Scheduled Meds: . cefTRIAXone (ROCEPHIN)  IV  1 g Intravenous Q24H  . multivitamin with minerals  1 tablet Oral Daily  . potassium chloride  30 mEq Oral BID  . sodium chloride  3 mL Intravenous Q12H  . thiamine  100 mg Oral Daily   Continuous Infusions:   Principal Problem:   ARF (acute renal failure) Active Problems:   Alcohol abuse   Metabolic acidosis   Weakness generalized   Failure to thrive in childhood   Severe  protein-calorie malnutrition   Bilateral leg weakness   Normocytic anemia  Time spent: 25  This note has been created with Education officer, environmental. Any transcriptional errors are unintentional.   Pamella Pert, MD [facility] Pager [phone]. If 7 PM - 7 AM, please contact night-coverage at [email or web address], password [facility] 02/20/2014, 11:38 AM  LOS: 2 days

## 2014-02-20 NOTE — Progress Notes (Signed)
Pt refused potassium and heparin. C/o pian and offered Tylenol, refused this saying that he only takes Aleve at home. MD aware. Pt cannot have Aleve due to Kidney disease.

## 2014-02-21 LAB — COMPREHENSIVE METABOLIC PANEL
ALT: 20 U/L (ref 0–53)
AST: 74 U/L — AB (ref 0–37)
Albumin: 1.7 g/dL — ABNORMAL LOW (ref 3.5–5.2)
Alkaline Phosphatase: 138 U/L — ABNORMAL HIGH (ref 39–117)
Anion gap: 15 (ref 5–15)
BUN: 29 mg/dL — ABNORMAL HIGH (ref 6–23)
CALCIUM: 8.1 mg/dL — AB (ref 8.4–10.5)
CO2: 20 meq/L (ref 19–32)
CREATININE: 1 mg/dL (ref 0.50–1.35)
Chloride: 102 mEq/L (ref 96–112)
GFR, EST AFRICAN AMERICAN: 85 mL/min — AB (ref >90)
GFR, EST NON AFRICAN AMERICAN: 74 mL/min — AB (ref >90)
Glucose, Bld: 101 mg/dL — ABNORMAL HIGH (ref 70–99)
Potassium: 2.5 mEq/L — CL (ref 3.7–5.3)
Sodium: 137 mEq/L (ref 137–147)
TOTAL PROTEIN: 6 g/dL (ref 6.0–8.3)
Total Bilirubin: 0.4 mg/dL (ref 0.3–1.2)

## 2014-02-21 LAB — CBC
HCT: 21.9 % — ABNORMAL LOW (ref 39.0–52.0)
Hemoglobin: 7.2 g/dL — ABNORMAL LOW (ref 13.0–17.0)
MCH: 30.1 pg (ref 26.0–34.0)
MCHC: 32.9 g/dL (ref 30.0–36.0)
MCV: 91.6 fL (ref 78.0–100.0)
Platelets: 244 10*3/uL (ref 150–400)
RBC: 2.39 MIL/uL — ABNORMAL LOW (ref 4.22–5.81)
RDW: 19.2 % — ABNORMAL HIGH (ref 11.5–15.5)
WBC: 3.3 10*3/uL — AB (ref 4.0–10.5)

## 2014-02-21 LAB — PREPARE RBC (CROSSMATCH)

## 2014-02-21 LAB — PHOSPHORUS: Phosphorus: 3.8 mg/dL (ref 2.3–4.6)

## 2014-02-21 LAB — MAGNESIUM: MAGNESIUM: 1.4 mg/dL — AB (ref 1.5–2.5)

## 2014-02-21 MED ORDER — MAGNESIUM SULFATE 40 MG/ML IJ SOLN
2.0000 g | Freq: Once | INTRAMUSCULAR | Status: AC
  Administered 2014-02-21: 2 g via INTRAVENOUS
  Filled 2014-02-21: qty 50

## 2014-02-21 MED ORDER — POTASSIUM CHLORIDE 10 MEQ/100ML IV SOLN
10.0000 meq | INTRAVENOUS | Status: AC
  Administered 2014-02-21 (×2): 10 meq via INTRAVENOUS

## 2014-02-21 MED ORDER — POTASSIUM CHLORIDE CRYS ER 20 MEQ PO TBCR
40.0000 meq | EXTENDED_RELEASE_TABLET | Freq: Once | ORAL | Status: AC
  Administered 2014-02-21: 40 meq via ORAL
  Filled 2014-02-21: qty 2

## 2014-02-21 MED ORDER — POTASSIUM CHLORIDE 10 MEQ/100ML IV SOLN: 10.0000 meq | INTRAVENOUS | Status: DC

## 2014-02-21 MED ORDER — PREDNISONE 10 MG PO TABS
10.0000 mg | ORAL_TABLET | Freq: Every day | ORAL | Status: DC
  Administered 2014-02-22 – 2014-02-25 (×4): 10 mg via ORAL
  Filled 2014-02-21 (×5): qty 1

## 2014-02-21 MED ORDER — POTASSIUM CHLORIDE 10 MEQ/100ML IV SOLN
10.0000 meq | INTRAVENOUS | Status: AC
  Administered 2014-02-21 (×2): 10 meq via INTRAVENOUS
  Filled 2014-02-21: qty 100

## 2014-02-21 NOTE — Progress Notes (Signed)
Clinical Child psychotherapistocial Worker (CSW) received call from RN asking who can give consent for patient to receive blood because family can't be reached and guardianship paper work has been started. CSW contacted clinical supervisor who reported that MD can make decision if it is an emergency. Please contact CSW if future needs arise.   Jetta LoutBailey Morgan, LCSWA Weekend CSW [phone]

## 2014-02-21 NOTE — Progress Notes (Signed)
PROGRESS NOTE  Jacob Riddle ZOX:[id number] DOB: [birth year] DOA: 02/18/2014 PCP: Catalina Pizza, MD  HPI: Jacob Riddle is a 73 y.o. male with a past medical history of alcohol dependence, who was sent over from skilled nursing facility with the above complaints. Patient was discharged from [facility] 2 days ago after being managed for alcohol withdrawal syndrome. He was noted to have bilateral lower extremity weakness, which was attributed to failure to thrive. He underwent MRI of his thoracic and lumbar spine, which revealed non specific findings. These findings were discussed with the neurosurgeon at that time and not felt to contribute to his symptoms. Patient is a very poor historian. He likely has cognitive impairment due to his alcoholism. Not much history was available from him. He did tell me that he had generalized weakness and that he was hurting all over. Denies any falls or injuries in the last 48-72 hours. History is very limited. Skilled nursing facility was reluctant to take him back due to the swelling and his poor functional status.  Subjective: - he is hungry this morning - intermittent confusion overnight - HH drop  Assessment/Plan: Acute renal failure - suspect component of poor po intake, also obstructive component with urinary retention, appreciate urology input. Plan for CT scan once renal function improves.  - renal function continues to improve, back to normal this morning, if stable will consider CT with contrast per Urology   DVT right lower extremity - based on the venous ultrasound. Patient continues to have significant hematuria and hemoglobin still decreasing. He is currently not a candidate for anticoagulation. I discussed his case with Dr. Fredia Sorrow from interventional radiology and he personally reviewed the findings. 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 The risk for PE with these clots are extremely small and he feels like there is no strong  indication to a filter unless patient has a pulmonary embolism. His renal function will hopefully allow a CTPA in 1-2 days. Clinically he is without hypoxia or chest pain. - start heparin sq prophylaxis, but intermittently refusing  Hematuria - Urology following  Joint swelling - looks like Gout. Will avoid NSAIDS, start Prednisone.   Anemia - 1U pRBC transfusion today  RLE swelling - due to #2  Alcohol abuse - abstinent now but with sequelae.  Acute encephalopathy - likely has underlying cognitive problems from long standing ETOH abuse. - patient with intermittent confusion tends to be worse at nighttime. I don't think he has understanding and capacity to make medical decisions but this needs to be re-evaluated closer to discharge  Severe protein calorie malnutrition - encourage po intake.   UTI - Ceftriaxone, urine cultures negative. Given initial UA and hematuria will continue antibiotics for now.   Generalized weakness - PT consult.  Diet: regular  Fluids: NS DVT Prophylaxis: heparin  Code Status: Full Family Communication: d/w patient  Disposition Plan: inpatient, SNF when ready   Consultants:  Urology   Procedures:  None    Antibiotics Ceftriaxone 7/16 >>  Objective: Filed Vitals:   02/20/14 0455 02/20/14 1453 02/20/14 2319 02/21/14 0649  BP: 170/49 157/51 99/64 114/56  Pulse: 105 87 103 99  Temp: 97.8 F (36.6 C) 98.4 F (36.9 C) 98.4 F (36.9 C) 99 F (37.2 C)  TempSrc: Oral  Oral Oral  Resp: 20 20 20 20   Height:      Weight:      SpO2: 99% 100% 100% 100%    Intake/Output Summary (Last 24 hours) at 02/21/14  1332 Last data filed at 02/21/14 0800  Gross per 24 hour  Intake    440 ml  Output   1375 ml  Net   -935 ml   Filed Weights   02/19/14 0037  Weight: 76.522 kg (168 lb 11.2 oz)    Exam:  GEN: NAD  CV: RRR  Lungs: moves air well  Ext: upper extremity edema, R leg swelling   Skin: no rashes  Data Reviewed: Basic Metabolic  Panel:  Recent Labs Lab 02/15/14 0626 02/16/14 0557 02/19/14 0613 02/20/14 0642 02/21/14 0545  NA 138 136* 135* 135* 137  K 4.1 3.9 3.9 3.0* 2.5*  CL 107 103 100 99 102  CO2 15* 18* 17* 16* 20  GLUCOSE 118* 124* 104* 100* 101*  BUN 20 26* 56* 45* 29*  CREATININE 1.05 1.29 2.10* 1.42* 1.00  CALCIUM 8.7 9.0 8.9 8.1* 8.1*  MG 1.7  --  1.7  --  1.4*  PHOS 3.6  --   --   --  3.8   Liver Function Tests:  Recent Labs Lab 02/19/14 0613 02/20/14 0642 02/21/14 0545  AST 35 57* 74*  ALT 13 17 20   ALKPHOS 142* 147* 138*  BILITOT 0.7 0.4 0.4  PROT 6.3 6.2 6.0  ALBUMIN 1.7* 1.7* 1.7*   CBC:  Recent Labs Lab 02/19/14 0613 02/20/14 0642 02/21/14 0545  WBC 7.6 5.4 3.3*  HGB 8.5* 7.9* 7.2*  HCT 25.5* 23.7* 21.9*  MCV 91.4 91.5 91.6  PLT 332 300 244   Cardiac Enzymes:  Recent Labs Lab 02/19/14 0851  CKTOTAL 153   Studies: [place] Abdomen Complete  02/19/2014   CLINICAL DATA:  Abnormal liver function tests, acute renal failure  EXAM: ULTRASOUND ABDOMEN COMPLETE  COMPARISON:  None.  FINDINGS: Gallbladder:  Dependent sludge, clot, or other debris is noted within the gallbladder. Gallbladder wall thickening measuring 7 mm is noted. No reported sonographic Murphy's side. No pericholecystic fluid.  Common bile duct:  Diameter: 3 mm  Liver:  Increased echogenicity with nodular contour but no focal abnormality or intrahepatic ductal dilatation.  IVC:  Obscured by overlying bowel gas and increased hepatic echogenicity  Pancreas:  Visualized portion unremarkable.  Spleen:  Size and appearance within normal limits.  Right Kidney:  Length: 12.5 cm. Cortical thinning and increased echogenicity without hydronephrosis or focal mass.  Left Kidney:  Length: 11.8 cm. Cortical thinning and increased echogenicity without hydronephrosis or focal mass.  Abdominal aorta:  No aneurysm visualized.  Other findings:  None.  IMPRESSION: Dependent sludge, clot, or other debris within the gallbladder with  gallbladder wall thickening. This could indicate acute cholecystitis in the appropriate clinical context, with metastatic or primary neoplastic mass significantly less likely in the absence of underlying known malignancy.  Bilateral renal cortical thinning and increased echogenicity, compatible with medical renal disease.   Electronically Signed   By: Christiana PellantGretchen  Green M.D.   On: 02/19/2014 15:38   [place] Pelvis Limited  02/19/2014   CLINICAL DATA:  Bladder distention, acute renal failure  EXAM: US PELVIS LIMITED  TECHNIQUE: Ultrasound examination of the pelvic soft tissues was performed in the area of clinical concern.  COMPARISON:  No recent similar comparison  FINDINGS: A Foley catheter is in place and the bladder is decompressed. The mucosa appears redundant and a small mass could be obscured by lack of distention by urine.  IMPRESSION: Bladder decompression with probable mucosal infolding.   Electronically Signed   By: Christiana PellantGretchen  Green M.D.   On: 02/19/2014 15:42  US Venous Img Lower Bilateral  02/19/2014   CLINICAL DATA:  Bilateral lower extremity pain and swelling. History of smoking. Evaluate for DVT.  EXAM: BILATERAL LOWER EXTREMITY VENOUS DOPPLER ULTRASOUND  TECHNIQUE: Gray-scale sonography with graded compression, as well as color Doppler and duplex ultrasound were performed to evaluate the lower extremity deep venous systems from the level of the common femoral vein and including the common femoral, femoral, profunda femoral, popliteal and calf veins including the posterior tibial, peroneal and gastrocnemius veins when visible. The superficial great saphenous vein was also interrogated. Spectral Doppler was utilized to evaluate flow at rest and with distal augmentation maneuvers in the common femoral, femoral and popliteal veins.  COMPARISON:  None.  FINDINGS: The examination is degraded due to patient body habitus and poor sonographic window.  RIGHT LOWER EXTREMITY  The right common and deep femoral  veins are widely patent. The right greater saphenous vein is patent proximally.  There is mixed echogenic occlusive thrombus throughout the right femoral vein extending through the right popliteal vein and into the imaged portions of the tibial veins.  LEFT LOWER EXTREMITY  The left common, superficial femoral, deep femoral and popliteal veins are widely patent. The tibial veins are not well visualized.  Other Findings:  None.  IMPRESSION: 1. Examination is positive for age-indeterminate occlusive thrombus throughout the right femoral and popliteal veins extending into the imaged portions of the right tibial veins. 2. No definite evidence of DVT within the left lower extremity, though note, the tibial veins of the left lower leg are not well visualized. These results will be called to the ordering clinician or representative by the Radiologist Assistant, and communication documented in the PACS or zVision Dashboard.   Electronically Signed   By: Simonne Come M.D.   On: 02/19/2014 16:00    Scheduled Meds: . cefTRIAXone (ROCEPHIN)  IV  1 g Intravenous Q24H  . heparin subcutaneous  5,000 Units Subcutaneous 3 times per day  . multivitamin with minerals  1 tablet Oral Daily  . sodium chloride  3 mL Intravenous Q12H  . thiamine  100 mg Oral Daily   Continuous Infusions:   Principal Problem:   ARF (acute renal failure) Active Problems:   Alcohol abuse   Metabolic acidosis   Weakness generalized   Failure to thrive in childhood   Severe protein-calorie malnutrition   Bilateral leg weakness   Normocytic anemia  Time spent: 25  This note has been created with Education officer, environmental. Any transcriptional errors are unintentional.   Pamella Pert, MD [facility] Pager [phone]. If 7 PM - 7 AM, please contact night-coverage at [email or web address], password [facility] 02/21/2014, 1:32 PM  LOS: 3 days

## 2014-02-21 NOTE — Progress Notes (Signed)
Phone number for friend, Antony ContrasRocky Cannon, not a working number.

## 2014-02-21 NOTE — Progress Notes (Signed)
CRITICAL VALUE ALERT  Critical value received:  K 2.5  Date of notification:  02/21/2014  Time of notification:  0746  Critical value read back:Yes.    Nurse who received alert:  MHawkins, RN  MD notified (1st page):  Dr. Elvera LennoxGherghe  Time of first page:  [phone]  MD notified (2nd page):  Time of second page:  Responding MD:  Dr. Elvera LennoxGherghe  Time MD responded:  Dr. Elvera LennoxGherghe entered orders for IV and po Potassium.

## 2014-02-22 LAB — COMPREHENSIVE METABOLIC PANEL
ALBUMIN: 1.8 g/dL — AB (ref 3.5–5.2)
ALK PHOS: 142 U/L — AB (ref 39–117)
ALT: 25 U/L (ref 0–53)
ANION GAP: 14 (ref 5–15)
AST: 89 U/L — ABNORMAL HIGH (ref 0–37)
BUN: 18 mg/dL (ref 6–23)
CO2: 19 mEq/L (ref 19–32)
Calcium: 8.2 mg/dL — ABNORMAL LOW (ref 8.4–10.5)
Chloride: 100 mEq/L (ref 96–112)
Creatinine, Ser: 0.82 mg/dL (ref 0.50–1.35)
GFR calc Af Amer: >90 mL/min (ref >90)
GFR calc non Af Amer: 87 mL/min — ABNORMAL LOW (ref >90)
Glucose, Bld: 105 mg/dL — ABNORMAL HIGH (ref 70–99)
POTASSIUM: 3.1 meq/L — AB (ref 3.7–5.3)
SODIUM: 133 meq/L — AB (ref 137–147)
TOTAL PROTEIN: 6.5 g/dL (ref 6.0–8.3)
Total Bilirubin: 0.5 mg/dL (ref 0.3–1.2)

## 2014-02-22 LAB — TYPE AND SCREEN
ABO/RH(D): O POS
Antibody Screen: NEGATIVE
Unit division: 0

## 2014-02-22 LAB — HIV ANTIBODY (ROUTINE TESTING W REFLEX): HIV: NONREACTIVE

## 2014-02-22 LAB — CBC
HCT: 25.6 % — ABNORMAL LOW (ref 39.0–52.0)
Hemoglobin: 8.6 g/dL — ABNORMAL LOW (ref 13.0–17.0)
MCH: 30.5 pg (ref 26.0–34.0)
MCHC: 33.6 g/dL (ref 30.0–36.0)
MCV: 90.8 fL (ref 78.0–100.0)
Platelets: 235 10*3/uL (ref 150–400)
RBC: 2.82 MIL/uL — ABNORMAL LOW (ref 4.22–5.81)
RDW: 18.4 % — AB (ref 11.5–15.5)
WBC: 3.9 10*3/uL — ABNORMAL LOW (ref 4.0–10.5)

## 2014-02-22 MED ORDER — POTASSIUM CHLORIDE CRYS ER 20 MEQ PO TBCR
40.0000 meq | EXTENDED_RELEASE_TABLET | Freq: Once | ORAL | Status: AC
  Administered 2014-02-22: 40 meq via ORAL
  Filled 2014-02-22: qty 2

## 2014-02-22 NOTE — Clinical Social Work Note (Signed)
CSW met w patient at bedside, patient oriented to self, time (syas "Tuesday" and "2015"), knows he is in a hospital in [place], says he does not want to return to [facility], understands he has a bed at [facility] and is willing to admit there at discharge.  CSW communicated above w MD and w [facility] admissions.  Edwyna Shell, LCSW Clinical Social Worker [phone])

## 2014-02-22 NOTE — Progress Notes (Signed)
PROGRESS NOTE  Jacob Riddle WUX:[id number] DOB: 11-08-1940 DOA: 02/18/2014 PCP: Catalina Pizza, MD  HPI: Jacob Riddle is a 73 y.o. male with a past medical history of alcohol dependence, who was sent over from skilled nursing facility with the above complaints. Patient was discharged from [facility] 2 days ago after being managed for alcohol withdrawal syndrome. He was noted to have bilateral lower extremity weakness, which was attributed to failure to thrive. He underwent MRI of his thoracic and lumbar spine, which revealed non specific findings. These findings were discussed with the neurosurgeon at that time and not felt to contribute to his symptoms. Patient is a very poor historian. He likely has cognitive impairment due to his alcoholism. Not much history was available from him. He did tell me that he had generalized weakness and that he was hurting all over. Denies any falls or injuries in the last 48-72 hours. History is very limited. Skilled nursing facility was reluctant to take him back due to the swelling and his poor functional status.  Subjective: - he is hungry this morning - mental status much better  Assessment/Plan: Acute renal failure - suspect component of poor po intake, also obstructive component with urinary retention, appreciate urology input. Plan for CT scan once renal function improves.  - renal function continues to improve, back to normal, polan for CT scan with and without IV contrast per Urology tomorrow.   DVT right lower extremity - based on the venous ultrasound. Patient continues to have significant hematuria and hemoglobin still decreasing. He is currently not a candidate for anticoagulation. I discussed his case with Dr. Fredia Sorrow from interventional radiology and he personally reviewed the findings. 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 The risk for PE with these clots are extremely small and he feels like there is no strong indication to a filter  unless patient has a pulmonary embolism. His renal function will likely allow a CTPA in 1 day. Clinically he is without hypoxia or chest pain. - start heparin sq prophylaxis, but intermittently refusing  Hematuria - Urology following  Joint swelling - looks like Gout. Will avoid NSAIDS, start Prednisone.   Anemia - 1U pRBC transfusion today  RLE swelling - due to #2  Alcohol abuse - abstinent now but with sequelae.  Acute encephalopathy - likely has underlying cognitive problems from long standing ETOH abuse. - slowly resolving  Severe protein calorie malnutrition - encourage po intake.   UTI - Ceftriaxone, urine cultures negative. Given initial UA and hematuria will continue antibiotics for now.   Generalized weakness - PT consult.  Diet: regular  Fluids: NS DVT Prophylaxis: heparin  Code Status: Full Family Communication: d/w patient  Disposition Plan: inpatient, SNF when ready   Consultants:  Urology   Procedures:  None    Antibiotics Ceftriaxone 7/16 >>  Objective: Filed Vitals:   02/21/14 1707 02/21/14 1845 02/21/14 2153 02/22/14 0700  BP: 144/67 157/69 88/50 136/66  Pulse: 101 98 91 80  Temp: 99 F (37.2 C) 98.5 F (36.9 C) 99.6 F (37.6 C) 97.8 F (36.6 C)  TempSrc: Oral Oral Oral Oral  Resp: 20 20 20 20   Height:      Weight:      SpO2:   100% 100%    Intake/Output Summary (Last 24 hours) at 02/22/14 1146 Last data filed at 02/22/14 0913  Gross per 24 hour  Intake  892.5 ml  Output      0 ml  Net  892.5  ml   Filed Weights   02/19/14 0037  Weight: 76.522 kg (168 lb 11.2 oz)    Exam:  GEN: NAD  CV: RRR  Lungs: moves air well  Ext: upper extremity edema, R leg swelling   Skin: no rashes  Data Reviewed: Basic Metabolic Panel:  Recent Labs Lab 02/16/14 0557 02/19/14 0613 02/20/14 0642 02/21/14 0545 02/22/14 0915  NA 136* 135* 135* 137 133*  K 3.9 3.9 3.0* 2.5* 3.1*  CL 103 100 99 102 100  CO2 18* 17* 16* 20 19  GLUCOSE  124* 104* 100* 101* 105*  BUN 26* 56* 45* 29* 18  CREATININE 1.29 2.10* 1.42* 1.00 0.82  CALCIUM 9.0 8.9 8.1* 8.1* 8.2*  MG  --  1.7  --  1.4*  --   PHOS  --   --   --  3.8  --    Liver Function Tests:  Recent Labs Lab 02/19/14 0613 02/20/14 0642 02/21/14 0545 02/22/14 0915  AST 35 57* 74* 89*  ALT 13 17 20 25   ALKPHOS 142* 147* 138* 142*  BILITOT 0.7 0.4 0.4 0.5  PROT 6.3 6.2 6.0 6.5  ALBUMIN 1.7* 1.7* 1.7* 1.8*   CBC:  Recent Labs Lab 02/19/14 0613 02/20/14 0642 02/21/14 0545 02/22/14 0915  WBC 7.6 5.4 3.3* 3.9*  HGB 8.5* 7.9* 7.2* 8.6*  HCT 25.5* 23.7* 21.9* 25.6*  MCV 91.4 91.5 91.6 90.8  PLT 332 300 244 235   Cardiac Enzymes:  Recent Labs Lab 02/19/14 0851  CKTOTAL 153   Studies: No results found.  Scheduled Meds: . cefTRIAXone (ROCEPHIN)  IV  1 g Intravenous Q24H  . heparin subcutaneous  5,000 Units Subcutaneous 3 times per day  . multivitamin with minerals  1 tablet Oral Daily  . predniSONE  10 mg Oral Q breakfast  . sodium chloride  3 mL Intravenous Q12H  . thiamine  100 mg Oral Daily   Continuous Infusions:   Principal Problem:   ARF (acute renal failure) Active Problems:   Alcohol abuse   Metabolic acidosis   Weakness generalized   Failure to thrive in childhood   Severe protein-calorie malnutrition   Bilateral leg weakness   Normocytic anemia  Time spent: 15  This note has been created with Education officer, environmentalDragon speech recognition software and smart phrase technology. Any transcriptional errors are unintentional.   Pamella Pertostin Gherghe, MD [facility] Pager [phone]. If 7 PM - 7 AM, please contact night-coverage at [email or web address], password [email or web address] 02/22/2014, 11:46 AM  LOS: 4 days

## 2014-02-23 ENCOUNTER — Encounter (HOSPITAL_COMMUNITY): Payer: Self-pay | Admitting: Radiology

## 2014-02-23 ENCOUNTER — Inpatient Hospital Stay (HOSPITAL_COMMUNITY): Payer: Medicare Other

## 2014-02-23 DIAGNOSIS — R310 Gross hematuria: Secondary | ICD-10-CM

## 2014-02-23 LAB — COMPREHENSIVE METABOLIC PANEL
ALT: 31 U/L (ref 0–53)
AST: 103 U/L — AB (ref 0–37)
Albumin: 1.7 g/dL — ABNORMAL LOW (ref 3.5–5.2)
Alkaline Phosphatase: 127 U/L — ABNORMAL HIGH (ref 39–117)
Anion gap: 13 (ref 5–15)
BUN: 14 mg/dL (ref 6–23)
CO2: 20 meq/L (ref 19–32)
CREATININE: 0.7 mg/dL (ref 0.50–1.35)
Calcium: 8.2 mg/dL — ABNORMAL LOW (ref 8.4–10.5)
Chloride: 102 mEq/L (ref 96–112)
GFR calc Af Amer: >90 mL/min (ref >90)
GFR calc non Af Amer: >90 mL/min (ref >90)
Glucose, Bld: 98 mg/dL (ref 70–99)
Potassium: 3.5 mEq/L — ABNORMAL LOW (ref 3.7–5.3)
Sodium: 135 mEq/L — ABNORMAL LOW (ref 137–147)
Total Bilirubin: 0.4 mg/dL (ref 0.3–1.2)
Total Protein: 6.3 g/dL (ref 6.0–8.3)

## 2014-02-23 LAB — CBC
HCT: 23.9 % — ABNORMAL LOW (ref 39.0–52.0)
Hemoglobin: 8 g/dL — ABNORMAL LOW (ref 13.0–17.0)
MCH: 30.4 pg (ref 26.0–34.0)
MCHC: 33.5 g/dL (ref 30.0–36.0)
MCV: 90.9 fL (ref 78.0–100.0)
PLATELETS: 234 10*3/uL (ref 150–400)
RBC: 2.63 MIL/uL — ABNORMAL LOW (ref 4.22–5.81)
RDW: 18.2 % — ABNORMAL HIGH (ref 11.5–15.5)
WBC: 2.9 10*3/uL — AB (ref 4.0–10.5)

## 2014-02-23 LAB — PROTIME-INR
INR: 1.06 (ref 0.00–1.49)
PROTHROMBIN TIME: 13.8 s (ref 11.6–15.2)

## 2014-02-23 LAB — MAGNESIUM: Magnesium: 1.5 mg/dL (ref 1.5–2.5)

## 2014-02-23 LAB — PHOSPHORUS: Phosphorus: 3.1 mg/dL (ref 2.3–4.6)

## 2014-02-23 MED ORDER — SODIUM CHLORIDE 0.9 % IJ SOLN
INTRAMUSCULAR | Status: AC
  Filled 2014-02-23: qty 3

## 2014-02-23 MED ORDER — SODIUM CHLORIDE 0.9 % IJ SOLN
INTRAMUSCULAR | Status: AC
  Filled 2014-02-23: qty 80

## 2014-02-23 MED ORDER — SODIUM CHLORIDE 0.9 % IJ SOLN
INTRAMUSCULAR | Status: AC
  Filled 2014-02-23: qty 12

## 2014-02-23 MED ORDER — SODIUM CHLORIDE 0.9 % IJ SOLN
INTRAMUSCULAR | Status: AC
  Filled 2014-02-23: qty 20

## 2014-02-23 MED ORDER — IOHEXOL 300 MG/ML  SOLN
100.0000 mL | Freq: Once | INTRAMUSCULAR | Status: AC | PRN
  Administered 2014-02-23: 100 mL via INTRAVENOUS

## 2014-02-23 NOTE — Progress Notes (Signed)
PROGRESS NOTE  Jacob LeavensJohn Zappulla ZOX:[id number] DOB: 1941-02-19 DOA: 02/18/2014 PCP: Catalina PizzaHALL, ZACH, MD  HPI: Jacob LeavensJohn Riddle is a 73 y.o. male with a past medical history of alcohol dependence, who was sent over from skilled nursing facility with the above complaints. Patient was discharged from [facility] 2 days ago after being managed for alcohol withdrawal syndrome. He was noted to have bilateral lower extremity weakness, which was attributed to failure to thrive. He underwent MRI of his thoracic and lumbar spine, which revealed non specific findings. These findings were discussed with the neurosurgeon at that time and not felt to contribute to his symptoms. Patient is a very poor historian. He likely has cognitive impairment due to his alcoholism. Not much history was available from him. He did tell me that he had generalized weakness and that he was hurting all over. Denies any falls or injuries in the last 48-72 hours. History is very limited. Skilled nursing facility was reluctant to take him back due to the swelling and his poor functional status.  Subjective: - mental status is significantly improved today   Assessment/Plan: Acute renal failure - suspect component of poor po intake, also obstructive component with urinary retention, much improved after Foley placement.   DVT right lower extremity - based on the venous ultrasound. Patient continues to have significant hematuria and hemoglobin still decreasing. He is currently not a candidate for anticoagulation. I discussed his case with Dr. Fredia SorrowYamagata from interventional radiology and he personally reviewed the findings. 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 The risk for PE with these clots are extremely small and he feels like there is no strong indication to a filter unless patient has a pulmonary embolism.  - plan for a CTPA today in conjunction with the tests that Urology recommended   Hematuria - his urine is starting to clear  up, still pink but better than in the past couple of days. Urology has been consulted and recommended a CT scan abdomen pelvis with and without contrast to better evaluate his obstruction and hematuria. His renal function has improved significantly in the past 2 days and these tests will be ordered today.   Joint swelling - looks like Gout. Will avoid NSAIDS, start Prednisone.   Anemia - 1U pRBC transfusion 7/20, still trending down today, repeat CBC in am.  RLE swelling - due to #2  Alcohol abuse - abstinent now but with sequelae.  Acute encephalopathy - likely has underlying cognitive problems from long standing ETOH abuse. - slowly resolving  Severe protein calorie malnutrition - encourage po intake.   UTI - Ceftriaxone, urine cultures negative. Given initial UA and hematuria will continue antibiotics for now.   Generalized weakness - PT consult.  Diet: regular  Fluids: NS DVT Prophylaxis: heparin  Code Status: Full Family Communication: d/w patient  Disposition Plan: inpatient, SNF when ready   Consultants:  Urology   Procedures:  None    Antibiotics Ceftriaxone 7/16 >>  Objective: Filed Vitals:   02/22/14 0700 02/22/14 1436 02/22/14 2329 02/23/14 0632  BP: 136/66 166/68 160/62 158/68  Pulse: 80 86 84 84  Temp: 97.8 F (36.6 C) 98.2 F (36.8 C) 98.3 F (36.8 C) 98.6 F (37 C)  TempSrc: Oral Oral Oral Oral  Resp: 20 19 20 20   Height:      Weight:      SpO2: 100% 99% 100% 98%    Intake/Output Summary (Last 24 hours) at 02/23/14 0846 Last data filed at 02/23/14 [phone]  Gross per 24 hour  Intake    630 ml  Output   2125 ml  Net  -1495 ml   Filed Weights   02/19/14 0037  Weight: 76.522 kg (168 lb 11.2 oz)    Exam:  GEN: NAD  CV: RRR  Lungs: moves air well  Ext: upper extremity edema, R leg swelling   Skin: no rashes  Data Reviewed: Basic Metabolic Panel:  Recent Labs Lab 02/19/14 0613 02/20/14 0642 02/21/14 0545 02/22/14 0915  02/23/14 0625  NA 135* 135* 137 133* 135*  K 3.9 3.0* 2.5* 3.1* 3.5*  CL 100 99 102 100 102  CO2 17* 16* 20 19 20   GLUCOSE 104* 100* 101* 105* 98  BUN 56* 45* 29* 18 14  CREATININE 2.10* 1.42* 1.00 0.82 0.70  CALCIUM 8.9 8.1* 8.1* 8.2* 8.2*  MG 1.7  --  1.4*  --  1.5  PHOS  --   --  3.8  --  3.1   Liver Function Tests:  Recent Labs Lab 02/19/14 0613 02/20/14 0642 02/21/14 0545 02/22/14 0915 02/23/14 0625  AST 35 57* 74* 89* 103*  ALT 13 17 20 25 31   ALKPHOS 142* 147* 138* 142* 127*  BILITOT 0.7 0.4 0.4 0.5 0.4  PROT 6.3 6.2 6.0 6.5 6.3  ALBUMIN 1.7* 1.7* 1.7* 1.8* 1.7*   CBC:  Recent Labs Lab 02/19/14 0613 02/20/14 0642 02/21/14 0545 02/22/14 0915 02/23/14 0625  WBC 7.6 5.4 3.3* 3.9* 2.9*  HGB 8.5* 7.9* 7.2* 8.6* 8.0*  HCT 25.5* 23.7* 21.9* 25.6* 23.9*  MCV 91.4 91.5 91.6 90.8 90.9  PLT 332 300 244 235 234   Cardiac Enzymes:  Recent Labs Lab 02/19/14 0851  CKTOTAL 153   Scheduled Meds: . cefTRIAXone (ROCEPHIN)  IV  1 g Intravenous Q24H  . heparin subcutaneous  5,000 Units Subcutaneous 3 times per day  . multivitamin with minerals  1 tablet Oral Daily  . predniSONE  10 mg Oral Q breakfast  . sodium chloride  3 mL Intravenous Q12H  . thiamine  100 mg Oral Daily   Continuous Infusions:   Principal Problem:   ARF (acute renal failure) Active Problems:   Alcohol abuse   Metabolic acidosis   Weakness generalized   Failure to thrive in childhood   Severe protein-calorie malnutrition   Bilateral leg weakness   Normocytic anemia  Time spent: 25  This note has been created with Education officer, environmental. Any transcriptional errors are unintentional.   Pamella Pert, MD [facility] Pager [phone]. If 7 PM - 7 AM, please contact night-coverage at [email or web address], password [facility] 02/23/2014, 8:46 AM  LOS: 5 days

## 2014-02-23 NOTE — Clinical Social Work Note (Signed)
[facility] [place] facility continues to be willing to accept patient at discharge.  Santa GeneraAnne Cunningham, LCSW Clinical Social Worker [phone])

## 2014-02-23 NOTE — Progress Notes (Signed)
Subjective: Patient reports that he is feeling better.  He is eating lunch.  He has not noted any blood in his urine recently.  Objective: Vital signs in last 24 hours: Temp:  [98.2 F (36.8 C)-98.6 F (37 C)] 98.6 F (37 C) (07/21 1914) Pulse Rate:  [84-86] 84 (07/21 0632) Resp:  [19-20] 20 (07/21 0632) BP: (158-166)/(62-68) 158/68 mmHg (07/21 0632) SpO2:  [98 %-100 %] 98 % (07/21 7829)  Intake/Output from previous day: 07/20 0701 - 07/21 0700 In: 630 [P.O.:480; I.V.:100; IV Piggyback:50] Out: 2125 [Urine:2125] Intake/Output this shift: Total I/O In: 120 [P.O.:120] Out: 400 [Urine:400]  Physical Exam:  Constitutional: Vital signs reviewed. WD WN in NAD   Eyes: PERRL, No scleral icterus.   Rectal: Normal anal sphincter tone.  theree were no rectal masses. prostate was flat, nonnodular, nontender.  Lab Results:  Recent Labs  02/21/14 0545 02/22/14 0915 02/23/14 0625  HGB 7.2* 8.6* 8.0*  HCT 21.9* 25.6* 23.9*   BMET  Recent Labs  02/22/14 0915 02/23/14 0625  NA 133* 135*  K 3.1* 3.5*  CL 100 102  CO2 19 20  GLUCOSE 105* 98  BUN 18 14  CREATININE 0.82 0.70  CALCIUM 8.2* 8.2*    Recent Labs  02/23/14 0625  INR 1.06   No results found for this basename: LABURIN,  in the last 72 hours Results for orders placed during the hospital encounter of 02/18/14  URINE CULTURE     Status: None   Collection Time    02/19/14  6:41 AM      Result Value Ref Range Status   Specimen Description URINE, CATHETERIZED   Final   Special Requests NONE   Final   Culture  Setup Time     Final   Value: 02/19/2014 13:18     Performed at [facility] Count     Final   Value: NO GROWTH     Performed at [facility]   Culture     Final   Value: NO GROWTH     Performed at [facility]   Report Status 02/20/2014 FINAL   Final    Studies/Results: Ct Abdomen Pelvis W Wo Contrast  02/23/2014   CLINICAL DATA:  Pain.  EXAM: CT ANGIOGRAPHY  CHEST  CT ABDOMEN AND PELVIS WITH CONTRAST  TECHNIQUE: Multidetector CT imaging of the chest was performed using the standard protocol during bolus administration of intravenous contrast. Multiplanar CT image reconstructions and MIPs were obtained to evaluate the vascular anatomy. Multidetector CT imaging of the abdomen and pelvis was performed using the standard protocol during bolus administration of intravenous contrast.  CONTRAST:  OMNIPAQUE IOHEXOL 300 MG/ML  SOLN  COMPARISON:  Ultrasound 02/19/2014, MRI 02/12/2014 P  FINDINGS: CTA CHEST FINDINGS  Thoracic aorta is atherosclerotic. No aneurysm or dissection. Coronary artery disease. Heart size normal. Pulmonary arteries are normal. No evidence of pulmonary embolus.  No significant mediastinal adenopathy. Thoracic esophagus is unremarkable.  Large airways are patent. Right base atelectasis and/or infiltrate.  Thyroid is unremarkable.  No significant adenopathy.  CT ABDOMEN and PELVIS FINDINGS  Liver normal. Mild splenomegaly. Splenosis. Pancreas is normal. No biliary distention. The gallbladder wall is slightly thickened in most likely contains sludge and/or small stones could. Cholelithiasis with cholecystitis cannot be excluded.  Adrenals are normal. No focal renal abnormality. No hydronephrosis. Foley catheter present in the bladder. The bladder wall is thickened. Active bladder pathology cannot be excluded. Prostate is minimally enlarged.  No  significant adenopathy. Dense aortoiliac atherosclerotic vascular disease and visceral atherosclerotic vascular disease is present. Renal artery atherosclerotic vascular disease present. There is symmetric renal perfusion. No aneurysm.  Appendix normal. Large amount of stool in colon. Sigmoid colonic diverticulosis. No evidence of diverticulitis. No bowel distention or free air. No mesenteric mass.  Disc space loss with endplate erosions noted L1-L2 and L3-L4. This suggests possibility of discitis.  Review of the  MIP images confirms the above findings.  IMPRESSION: 1. No evidence pulmonary embolus.  Coronary artery disease. 2. Right lung base atelectasis and or mild infiltrate.  3. Thickened gallbladder wall with probable sludge and/or small stones. Cholecystitis cannot be excluded. 4. Cannot exclude discitis at L1-L2 and L3-L4. 5. Mild splenomegaly.   Electronically Signed   By: Maisie Fus  Register   On: 02/23/2014 11:15   Ct Angio Chest Pe W/cm &/or Wo Cm  02/23/2014   CLINICAL DATA:  Pain.  EXAM: CT ANGIOGRAPHY CHEST  CT ABDOMEN AND PELVIS WITH CONTRAST  TECHNIQUE: Multidetector CT imaging of the chest was performed using the standard protocol during bolus administration of intravenous contrast. Multiplanar CT image reconstructions and MIPs were obtained to evaluate the vascular anatomy. Multidetector CT imaging of the abdomen and pelvis was performed using the standard protocol during bolus administration of intravenous contrast.  CONTRAST:  OMNIPAQUE IOHEXOL 300 MG/ML  SOLN  COMPARISON:  Ultrasound 02/19/2014, MRI 02/12/2014 P  FINDINGS: CTA CHEST FINDINGS  Thoracic aorta is atherosclerotic. No aneurysm or dissection. Coronary artery disease. Heart size normal. Pulmonary arteries are normal. No evidence of pulmonary embolus.  No significant mediastinal adenopathy. Thoracic esophagus is unremarkable.  Large airways are patent. Right base atelectasis and/or infiltrate.  Thyroid is unremarkable.  No significant adenopathy.  CT ABDOMEN and PELVIS FINDINGS  Liver normal. Mild splenomegaly. Splenosis. Pancreas is normal. No biliary distention. The gallbladder wall is slightly thickened in most likely contains sludge and/or small stones could. Cholelithiasis with cholecystitis cannot be excluded.  Adrenals are normal. No focal renal abnormality. No hydronephrosis. Foley catheter present in the bladder. The bladder wall is thickened. Active bladder pathology cannot be excluded. Prostate is minimally enlarged.  No  significant adenopathy. Dense aortoiliac atherosclerotic vascular disease and visceral atherosclerotic vascular disease is present. Renal artery atherosclerotic vascular disease present. There is symmetric renal perfusion. No aneurysm.  Appendix normal. Large amount of stool in colon. Sigmoid colonic diverticulosis. No evidence of diverticulitis. No bowel distention or free air. No mesenteric mass.  Disc space loss with endplate erosions noted L1-L2 and L3-L4. This suggests possibility of discitis.  Review of the MIP images confirms the above findings.  IMPRESSION: 1. No evidence pulmonary embolus.  Coronary artery disease. 2. Right lung base atelectasis and or mild infiltrate.  3. Thickened gallbladder wall with probable sludge and/or small stones. Cholecystitis cannot be excluded. 4. Cannot exclude discitis at L1-L2 and L3-L4. 5. Mild splenomegaly.   Electronically Signed   By: Maisie Fus  Register   On: 02/23/2014 11:15    Assessment/Plan:   Gross hematuria, resolving.  I reviewed the CT images.  His kidneys looked fine.  Catheter is adequately positioned within the bladder.his prostatic exam is normal.  At this point, I think it is okay to remove his catheter-I have ordered that to be done for a voiding trial in the morning.  If he does not void adequately, I have written for the catheter to be replaced.  I think it is okay to follow up here in regional after discharge with or without  the catheter.   LOS: 5 days   Marcine MatarDAHLSTEDT, Krystol Rocco M 02/23/2014, 12:53 PM

## 2014-02-24 ENCOUNTER — Encounter (HOSPITAL_COMMUNITY): Payer: Self-pay | Admitting: Internal Medicine

## 2014-02-24 DIAGNOSIS — I472 Ventricular tachycardia: Secondary | ICD-10-CM | POA: Diagnosis not present

## 2014-02-24 DIAGNOSIS — M109 Gout, unspecified: Secondary | ICD-10-CM | POA: Diagnosis present

## 2014-02-24 DIAGNOSIS — R0609 Other forms of dyspnea: Secondary | ICD-10-CM

## 2014-02-24 DIAGNOSIS — I4729 Other ventricular tachycardia: Secondary | ICD-10-CM | POA: Diagnosis not present

## 2014-02-24 DIAGNOSIS — D62 Acute posthemorrhagic anemia: Secondary | ICD-10-CM

## 2014-02-24 DIAGNOSIS — R0989 Other specified symptoms and signs involving the circulatory and respiratory systems: Secondary | ICD-10-CM

## 2014-02-24 DIAGNOSIS — I825Z9 Chronic embolism and thrombosis of unspecified deep veins of unspecified distal lower extremity: Secondary | ICD-10-CM | POA: Diagnosis present

## 2014-02-24 DIAGNOSIS — K5900 Constipation, unspecified: Secondary | ICD-10-CM | POA: Diagnosis present

## 2014-02-24 DIAGNOSIS — I10 Essential (primary) hypertension: Secondary | ICD-10-CM | POA: Diagnosis present

## 2014-02-24 DIAGNOSIS — K8020 Calculus of gallbladder without cholecystitis without obstruction: Secondary | ICD-10-CM

## 2014-02-24 DIAGNOSIS — R310 Gross hematuria: Secondary | ICD-10-CM

## 2014-02-24 DIAGNOSIS — D72819 Decreased white blood cell count, unspecified: Secondary | ICD-10-CM | POA: Diagnosis present

## 2014-02-24 HISTORY — DX: Gross hematuria: R31.0

## 2014-02-24 HISTORY — DX: Calculus of gallbladder without cholecystitis without obstruction: K80.20

## 2014-02-24 HISTORY — DX: Gout, unspecified: M10.9

## 2014-02-24 LAB — CBC
HCT: 19.6 % — ABNORMAL LOW (ref 39.0–52.0)
HCT: 25.6 % — ABNORMAL LOW (ref 39.0–52.0)
HEMOGLOBIN: 6.4 g/dL — AB (ref 13.0–17.0)
Hemoglobin: 8.4 g/dL — ABNORMAL LOW (ref 13.0–17.0)
MCH: 29.8 pg (ref 26.0–34.0)
MCH: 29.8 pg (ref 26.0–34.0)
MCHC: 32.7 g/dL (ref 30.0–36.0)
MCHC: 32.8 g/dL (ref 30.0–36.0)
MCV: 91.2 fL (ref 78.0–100.0)
MCV: 90.8 fL (ref 78.0–100.0)
PLATELETS: 317 10*3/uL (ref 150–400)
Platelets: 238 10*3/uL (ref 150–400)
RBC: 2.15 MIL/uL — ABNORMAL LOW (ref 4.22–5.81)
RBC: 2.82 MIL/uL — ABNORMAL LOW (ref 4.22–5.81)
RDW: 18.1 % — AB (ref 11.5–15.5)
RDW: 18.1 % — AB (ref 11.5–15.5)
WBC: 3.7 10*3/uL — ABNORMAL LOW (ref 4.0–10.5)
WBC: 2.7 10*3/uL — ABNORMAL LOW (ref 4.0–10.5)

## 2014-02-24 LAB — COMPREHENSIVE METABOLIC PANEL
ALBUMIN: 1.9 g/dL — AB (ref 3.5–5.2)
ALK PHOS: 126 U/L — AB (ref 39–117)
ALT: 32 U/L (ref 0–53)
AST: 89 U/L — AB (ref 0–37)
Anion gap: 16 — ABNORMAL HIGH (ref 5–15)
BUN: 15 mg/dL (ref 6–23)
CO2: 19 mEq/L (ref 19–32)
Calcium: 8.3 mg/dL — ABNORMAL LOW (ref 8.4–10.5)
Chloride: 101 mEq/L (ref 96–112)
Creatinine, Ser: 0.79 mg/dL (ref 0.50–1.35)
GFR calc non Af Amer: 88 mL/min — ABNORMAL LOW (ref >90)
GLUCOSE: 102 mg/dL — AB (ref 70–99)
POTASSIUM: 3.4 meq/L — AB (ref 3.7–5.3)
Sodium: 136 mEq/L — ABNORMAL LOW (ref 137–147)
Total Bilirubin: 0.5 mg/dL (ref 0.3–1.2)
Total Protein: 6.5 g/dL (ref 6.0–8.3)

## 2014-02-24 LAB — MAGNESIUM: MAGNESIUM: 1.6 mg/dL (ref 1.5–2.5)

## 2014-02-24 LAB — PROTIME-INR
INR: 1.06 (ref 0.00–1.49)
Prothrombin Time: 13.8 seconds (ref 11.6–15.2)

## 2014-02-24 LAB — PHOSPHORUS: PHOSPHORUS: 3.4 mg/dL (ref 2.3–4.6)

## 2014-02-24 LAB — HEMOGLOBIN AND HEMATOCRIT, BLOOD
HEMATOCRIT: 27.2 % — AB (ref 39.0–52.0)
HEMOGLOBIN: 9 g/dL — AB (ref 13.0–17.0)

## 2014-02-24 LAB — PREPARE RBC (CROSSMATCH)

## 2014-02-24 MED ORDER — MAGNESIUM HYDROXIDE 400 MG/5ML PO SUSP
15.0000 mL | Freq: Every day | ORAL | Status: DC
  Administered 2014-02-24 – 2014-02-25 (×2): 15 mL via ORAL
  Filled 2014-02-24 (×2): qty 30

## 2014-02-24 MED ORDER — DICLOFENAC SODIUM 1 % TD GEL
TRANSDERMAL | Status: AC
  Filled 2014-02-24: qty 100

## 2014-02-24 MED ORDER — MAGNESIUM SULFATE IN D5W 10-5 MG/ML-% IV SOLN
INTRAVENOUS | Status: AC
  Filled 2014-02-24: qty 100

## 2014-02-24 MED ORDER — DICLOFENAC SODIUM 1 % TD GEL
2.0000 g | Freq: Three times a day (TID) | TRANSDERMAL | Status: DC
  Administered 2014-02-24 – 2014-02-25 (×2): 2 g via TOPICAL
  Filled 2014-02-24: qty 100

## 2014-02-24 MED ORDER — AMLODIPINE BESYLATE 5 MG PO TABS
5.0000 mg | ORAL_TABLET | Freq: Every day | ORAL | Status: DC
  Administered 2014-02-24 – 2014-02-25 (×2): 5 mg via ORAL
  Filled 2014-02-24 (×2): qty 1

## 2014-02-24 MED ORDER — PANTOPRAZOLE SODIUM 40 MG PO TBEC
40.0000 mg | DELAYED_RELEASE_TABLET | Freq: Every day | ORAL | Status: DC
  Administered 2014-02-24 – 2014-02-25 (×2): 40 mg via ORAL
  Filled 2014-02-24 (×2): qty 1

## 2014-02-24 MED ORDER — POLYETHYLENE GLYCOL 3350 17 G PO PACK: 17.0000 g | PACK | Freq: Every day | ORAL | Status: DC

## 2014-02-24 MED ORDER — MAGNESIUM SULFATE 50 % IJ SOLN
1.0000 g | Freq: Once | INTRAMUSCULAR | Status: AC
  Administered 2014-02-24: 1 g via INTRAVENOUS
  Filled 2014-02-24: qty 2

## 2014-02-24 MED ORDER — POTASSIUM CHLORIDE CRYS ER 20 MEQ PO TBCR
20.0000 meq | EXTENDED_RELEASE_TABLET | Freq: Two times a day (BID) | ORAL | Status: DC
  Administered 2014-02-24 – 2014-02-25 (×3): 20 meq via ORAL
  Filled 2014-02-24 (×3): qty 1

## 2014-02-24 MED ORDER — FUROSEMIDE 10 MG/ML IJ SOLN
20.0000 mg | Freq: Once | INTRAMUSCULAR | Status: AC
  Administered 2014-02-24: 20 mg via INTRAVENOUS
  Filled 2014-02-24: qty 2

## 2014-02-24 NOTE — Progress Notes (Signed)
Occupational Therapy Treatment Patient Details Name: Jacob Riddle MRN: [id number] DOB: 07-24-1941 Today's Date: 02/24/2014    History of present illness Jacob Riddle is a 73 y.o. male with a past medical history of alcohol dependence, who was sent over from skilled nursing facility with the above complaints. Patient was discharged from [facility] 2 days ago after being managed for alcohol withdrawal syndrome. He was noted to have bilateral lower extremity weakness, which was attributed to failure to thrive. He underwent MRI of his thoracic and lumbar spine, which revealed non specific findings. These findings were discussed with the neurosurgeon at that time and not felt to contribute to his symptoms. Patient is a very poor historian. He likely has cognitive impairment due to his alcoholism. Not much history was available from him. He did tell me that he had generalized weakness and that he was hurting all over. Denies any falls or injuries in the last 48-72 hours. History is very limited. Skilled nursing facility was reluctant to take him back due to the swelling and his poor functional status.   OT comments  Pt has improved use of BUE this session.  Edema has decreased and pt is able to grasp objects in B hands (needs encouragement to do so). Pt able to feed himself breakfast this morning.  Pt initially declined therapy session, but was agreeable to attempting to eat breakfast. Reminded pt again of importance of engaging in therapy sessions to promote improvements in strength and function.  Pt continues with some confusion - was unaware he was in [facility] this morning.  Follow Up Recommendations       Equipment Recommendations       Recommendations for Other Services      Precautions / Restrictions Precautions Precautions: Fall Restrictions Weight Bearing Restrictions: No       Mobility Bed Mobility Overal bed mobility: Needs Assistance                Transfers                       Balance                                   ADL   Eating/Feeding: Set up Eating/Feeding Details (indicate cue type and reason): Pt has significanly less edema in B hands today.  Was able to grasp coffee cup (with lid and straw) to drink. Is able to grasp spoon to carry grits from bowl to mouth.  Does not require hand-over-hand assist. pt ahs hesitant at first to hold objects, and was fearful of dropping them, but with encouragemetn is able to do so. Grooming: Set up Grooming Details (indicate cue type and reason): pt is able to grasp comb and toothbrush. Declined to comb hair or preform oral hyginene.                                      Vision                     Perception     Praxis      Cognition   Behavior During Therapy: [facility] for tasks assessed/performed Overall Cognitive Status: No family/caregiver present to determine baseline cognitive functioning Area of Impairment: Orientation Orientation Level: Disoriented to;Time;Place  General Comments: Pt also requsted assist to go back to his house to get banking records, and go by the bank. Did not question further pt's desire to return to house (pt currenlty has no house) due to MD entering room.    Extremity/Trunk Assessment               Exercises     Shoulder Instructions       General Comments      Pertinent Vitals/ Pain         Home Living                                          Prior Functioning/Environment              Frequency       Progress Toward Goals  OT Goals(current goals can now be found in the care plan section)  Progress towards OT goals: Goals met and updated - see care plan  ADL Goals Pt Will Perform Eating: with supervision Pt Will Perform Grooming: with supervision Pt/caregiver will Perform Home Exercise Program: Increased ROM;Increased strength;Both right and left upper extremity   Plan Discharge plan remains appropriate    Co-evaluation                 End of Session     Activity Tolerance Patient tolerated treatment well   Patient Left with bed alarm set;in bed;with call bell/phone within reach   Nurse Communication          Time: 0254-8628 OT Time Calculation (min): 30 min  Charges: OT General Charges $OT Visit: 1 Procedure OT Treatments $Self Care/Home Management : 23-37 mins  Bea Graff, MS, OTR/L [phone]  02/24/2014, 9:29 AM

## 2014-02-24 NOTE — Progress Notes (Signed)
It looks like patient failed his voiding trial this morning.  Leave catheter in, was scheduled to see Dr. Annabell HowellsWrenn in [place] following discharge

## 2014-02-24 NOTE — Progress Notes (Signed)
Patient with critical lab Hg 6.4. Dr. Sherrie MustacheFisher notified.

## 2014-02-24 NOTE — Progress Notes (Signed)
Physical Therapy Treatment Patient Details Name: Jacob Riddle MRN: [id number] DOB: 17-Jan-1941 Today's Date: 02/24/2014    History of Present Illness      PT Comments    Pt pleasant and willing to participate with therapy today.  Pt limited by pain and fatigue from weakness.  Session focus on improving bed mobility.  Pt able to roll sidelying independently following verbal cueing for hand placement to assist.  Mod assistance required with sidelying to sitting.  Mod assistance required with seated balance due to weak core musculature requiring HHA to sit on EOB.  Pt limited by fatigue with sidelying to sit requiring rest breaks in supine position.  Mod assistance required with scooting forward and backwards on EOB.  Attempted sit to stand, pt unwilling to try due to pain and fatigue.  Pt left in bed with call bell within reach.  Follow Up Recommendations        Equipment Recommendations       Recommendations for Other Services       Precautions / Restrictions Precautions Precautions: Fall Restrictions Weight Bearing Restrictions: No    Mobility  Bed Mobility Overal bed mobility: Needs Assistance Bed Mobility: Supine to Sit;Sit to Supine;Sidelying to Sit   Sidelying to sit: Mod assist   Sit to supine: Mod assist   General bed mobility comments: Cueing for handplacement to assist with suping to sit, mod assistnace required with LE and sidelying to sit; multiple attempts made due to fatigue with sitting  Transfers                 General transfer comment: unable to assess due to fatigue  Ambulation/Gait                 Stairs            Wheelchair Mobility    Modified Rankin (Stroke Patients Only)       Balance                                    Cognition Arousal/Alertness: Awake/alert Behavior During Therapy: WFL for tasks assessed/performed Overall Cognitive Status: No family/caregiver present to determine baseline  cognitive functioning                      Exercises      General Comments        Pertinent Vitals/Pain Generalized pain scale 8/10 for all joints.  RN aware, pain medication given.      Home Living                      Prior Function            PT Goals (current goals can now be found in the care plan section) Progress towards PT goals: Progressing toward goals    Frequency       PT Plan Current plan remains appropriate    Co-evaluation             End of Session   Activity Tolerance: Patient limited by pain;Patient limited by fatigue Patient left: in bed;with call bell/phone within reach;with bed alarm set     Time: 1540-1607 PT Time Calculation (min): 27 min  Charges:  $Therapeutic Activity: 8-22 mins                    G Codes:  Juel BurrowCockerham, Casey Jo 02/24/2014, 4:07 PM

## 2014-02-24 NOTE — Progress Notes (Signed)
Patient had a 34 beat run of Vtach, asymptomatic. Dr. Sherrie MustacheFisher notified

## 2014-02-24 NOTE — Progress Notes (Signed)
  Echocardiogram 2D Echocardiogram has been performed.  Almetta Liddicoat 02/24/2014, 3:13 PM

## 2014-02-24 NOTE — Progress Notes (Signed)
PROGRESS NOTE  Jacob LeavensJohn Bartram [id number] DOB: May 27, 1941 DOA: 02/18/2014 PCP: Catalina PizzaHALL, ZACH, MD  HPI: Jacob LeavensJohn Riddle is a 73 y.o. male with a past medical history of alcohol dependence, who was sent over from skilled nursing facility with the above complaints. [facility] after being managed for alcohol withdrawal syndrome. He was noted to have bilateral lower extremity weakness, which was attributed to failure to thrive. He underwent MRI of his thoracic and lumbar spine, which revealed non specific findings. These findings were discussed with the neurosurgeon at that time and not felt to contribute to his symptoms. Patient is a very poor historian. He likely has cognitive impairment due to his alcoholism. Not much history was available from him. He did tell Dr. Elvera LennoxGherghe that he had generalized weakness and that he was hurting all over. Denied any falls or injuries prior to admission. History on admission was very limited. Skilled nursing facility was reluctant to take him back due to the swelling and his poor functional status.  Subjective: The patient says that he is feeling better today. He continues to have pain in his left greater than right shoulders and hands. He attributes this to "bad" arthritis.   Assessment/Plan: Acute renal failure -  This was likely secondary to prerenal azotemia from poor po intake and obstructive component with urinary retention. Status post Foley catheter placement and IV fluids. His renal function has normalized. His urine output is adequate.  Urinary retention/obstructive uropathy. This is secondary to possible blood clots from hematuria. Status post Foley catheter, now discontinued. He is urinating without difficulty. Urology has evaluated the patient and recommended a CT of the abdomen and pelvis. The CT revealed no hydronephrosis, no focal renal abnormality, a bladder wall that was thickened and minimally enlarged prostate. It  also revealed constipation which may have contributed to his urinary retention.  Hematuria - Etiology unclear, but could have been secondary to a UTI in the setting of urinary retention. His urine is starting to clear. His followup hemoglobin was reported at 6.4, and therefore, subcutaneous heparin was discontinued and Protonix was added empirically. However, the repeat hemoglobin was 9.0. For now, we'll use SCDs for DVT prophylaxis.  DVT right lower extremity -  Based on the venous ultrasound. Patient was having significant hematuria and hemoglobin was decreasing. He is currently not a candidate for anticoagulation. Dr. Elvera LennoxGherghe  his case with Dr. Fredia SorrowYamagata from interventional radiology and he personally reviewed the findings. The clot appeared chronic in nature and was non obstructive. The risk for PE with these clots are extremely small and he felt like there was no strong indication to place a filter unless patient had a pulmonary embolism. CT of the chest revealed no evidence of PE. We'll hold heparin until his hemoglobin and hematocrit are reevaluated. We'll use SCDs for DVT prophylaxis.  Anemia  Acute on chronic anemia, secondary to blood loss from hematuria. Status post 1U pRBC transfusion on 7/20. Hemoglobin 6.4, repeated at 9.0. We'll continue to monitor.  Joint pain swelling Given his elevated uric acid level, this is likely an acute on chronic gout exacerbation. Prednisone was started empirically. We'll continue low-dose prednisone and will add topical Voltaren to the most symptomatic join  RLE swelling - due to #2  Alcohol abuse - abstinent now but with sequelae.  Acute encephalopathy - likely has underlying cognitive problems from long standing ETOH abuse. - slowly resolving  Severe protein calorie malnutrition - encouraged po intake.   UTI -  Ceftriaxone, urine cultures negative. Given initial UA and hematuria will continue antibiotics for now.   Generalized weakness - PT  consulted and recommended skilled nursing facility placement.  Hypokalemia. Will replete.  Mild peripheral edema, possibly secondary to IV fluids. IV fluids have been discontinued. We'll give him a small amount of Lasix.  Hypertension. His blood pressures trending up. Will restart amlodipine.  Constipation, per CT of the abdomen. Will start daily milk of magnesia.  Severe deconditioning. Plan is to discharge him back to the skilled nursing facility for rehabilitation.  Diet: regular  Fluids: NS-now saline locked DVT Prophylaxis: heparin-DC'd; started SCDs on 7/22  Code Status: Full Family Communication: d/w patient  Disposition Plan: inpatient, SNF when ready   Consultants:  Urology   Procedures:  None    Antibiotics Ceftriaxone 7/16 >>  Objective: Filed Vitals:   02/22/14 2329 02/23/14 0632 02/23/14 1531 02/24/14 0528  BP: 160/62 158/68 145/78 176/64  Pulse: 84 84 82 87  Temp: 98.3 F (36.8 C) 98.6 F (37 C) 98.3 F (36.8 C) 97.5 F (36.4 C)  TempSrc: Oral Oral Oral Oral  Resp: 20 20 20 20   Height:      Weight:      SpO2: 100% 98% 99% 99%    Intake/Output Summary (Last 24 hours) at 02/24/14 1033 Last data filed at 02/24/14 0529  Gross per 24 hour  Intake    290 ml  Output   1575 ml  Net  -1285 ml   Filed Weights   02/19/14 0037  Weight: 76.522 kg (168 lb 11.2 oz)    Exam:  GEN: Debilitated-appearing 73 year old man sitting up in bed, in no acute distress.  CV: S1, S2, with a soft systolic murmur.  Lungs: Rare crackles in the bases, breathing is nonlabored.  Ext: Bilateral upper extremity edema with severe hypertrophic arthritic changes in his hands and trace to 1+ bilateral lower extremity edema. Patient has limited range of motion of left greater than right shoulder.   Abdomen: Positive bowel sounds, soft, nontender, nondistended.  Neurologic: He is alert and oriented x2. His speech is clear. Cranial nerves II through XII appear to be  grossly intact.  Data Reviewed: Basic Metabolic Panel:  Recent Labs Lab 02/19/14 0613 02/20/14 1610 02/21/14 0545 02/22/14 0915 02/23/14 0625 02/24/14 0557  NA 135* 135* 137 133* 135* 136*  K 3.9 3.0* 2.5* 3.1* 3.5* 3.4*  CL 100 99 102 100 102 101  CO2 17* 16* 20 19 20 19   GLUCOSE 104* 100* 101* 105* 98 102*  BUN 56* 45* 29* 18 14 15   CREATININE 2.10* 1.42* 1.00 0.82 0.70 0.79  CALCIUM 8.9 8.1* 8.1* 8.2* 8.2* 8.3*  MG 1.7  --  1.4*  --  1.5 1.6  PHOS  --   --  3.8  --  3.1 3.4   Liver Function Tests:  Recent Labs Lab 02/20/14 0642 02/21/14 0545 02/22/14 0915 02/23/14 0625 02/24/14 0557  AST 57* 74* 89* 103* 89*  ALT 17 20 25 31  32  ALKPHOS 147* 138* 142* 127* 126*  BILITOT 0.4 0.4 0.5 0.4 0.5  PROT 6.2 6.0 6.5 6.3 6.5  ALBUMIN 1.7* 1.7* 1.8* 1.7* 1.9*   CBC:  Recent Labs Lab 02/20/14 9604 02/21/14 0545 02/22/14 0915 02/23/14 0625 02/24/14 0557 02/24/14 0818  WBC 5.4 3.3* 3.9* 2.9* 3.7*  --   HGB 7.9* 7.2* 8.6* 8.0* 6.4* 9.0*  HCT 23.7* 21.9* 25.6* 23.9* 19.6* 27.2*  MCV 91.5 91.6 90.8 90.9 91.2  --  PLT 300 244 235 234 317  --    Cardiac Enzymes:  Recent Labs Lab 02/19/14 0851  CKTOTAL 153   Scheduled Meds: . cefTRIAXone (ROCEPHIN)  IV  1 g Intravenous Q24H  . magnesium hydroxide  15 mL Oral Daily  . multivitamin with minerals  1 tablet Oral Daily  . pantoprazole  40 mg Oral Daily  . potassium chloride  20 mEq Oral BID  . predniSONE  10 mg Oral Q breakfast  . sodium chloride  3 mL Intravenous Q12H  . thiamine  100 mg Oral Daily   Continuous Infusions:   Principal Problem:   ARF (acute renal failure) Active Problems:   Acute blood loss anemia   Hematuria, gross   Lower leg DVT (deep venous thromboembolism), chronic   Alcohol abuse   Metabolic acidosis   Weakness generalized   Failure to thrive in childhood   Severe protein-calorie malnutrition   Bilateral leg weakness   Cholelithiasis   Constipation   Leukopenia  Time spent:  25  This note has been created with Education officer, environmental. Any transcriptional errors are unintentional.   Pamella Pert, MD [facility] Pager [phone]. If 7 PM - 7 AM, please contact night-coverage at [email or web address], password [facility] 02/24/2014, 10:33 AM  LOS: 6 days

## 2014-02-24 NOTE — Clinical Social Work Note (Signed)
[facility] at [facility].  Santa GeneraAnne Cunningham, LCSW Clinical Social Worker [phone])

## 2014-02-24 NOTE — Progress Notes (Signed)
Patient had Foley removed this morning. Patient voided once. Bladder scan performed, risdual greater than 500cc. Foley catheter reinserted as ordered. Dr. Sherrie MustacheFisher notiifed.

## 2014-02-25 ENCOUNTER — Encounter (HOSPITAL_COMMUNITY): Payer: Self-pay | Admitting: Internal Medicine

## 2014-02-25 DIAGNOSIS — R339 Retention of urine, unspecified: Secondary | ICD-10-CM

## 2014-02-25 DIAGNOSIS — R310 Gross hematuria: Secondary | ICD-10-CM

## 2014-02-25 HISTORY — DX: Retention of urine, unspecified: R33.9

## 2014-02-25 LAB — COMPREHENSIVE METABOLIC PANEL
ALBUMIN: 1.9 g/dL — AB (ref 3.5–5.2)
ALT: 27 U/L (ref 0–53)
AST: 69 U/L — AB (ref 0–37)
Alkaline Phosphatase: 118 U/L — ABNORMAL HIGH (ref 39–117)
Anion gap: 11 (ref 5–15)
BUN: 14 mg/dL (ref 6–23)
CALCIUM: 8.2 mg/dL — AB (ref 8.4–10.5)
CHLORIDE: 100 meq/L (ref 96–112)
CO2: 23 mEq/L (ref 19–32)
CREATININE: 0.76 mg/dL (ref 0.50–1.35)
GFR calc Af Amer: >90 mL/min (ref >90)
GFR calc non Af Amer: 90 mL/min — ABNORMAL LOW (ref >90)
Glucose, Bld: 108 mg/dL — ABNORMAL HIGH (ref 70–99)
Potassium: 4.1 mEq/L (ref 3.7–5.3)
SODIUM: 134 meq/L — AB (ref 137–147)
Total Bilirubin: 0.3 mg/dL (ref 0.3–1.2)
Total Protein: 6.4 g/dL (ref 6.0–8.3)

## 2014-02-25 LAB — MAGNESIUM: MAGNESIUM: 1.8 mg/dL (ref 1.5–2.5)

## 2014-02-25 LAB — CBC
HCT: 25.8 % — ABNORMAL LOW (ref 39.0–52.0)
Hemoglobin: 8.6 g/dL — ABNORMAL LOW (ref 13.0–17.0)
MCH: 30.4 pg (ref 26.0–34.0)
MCHC: 33.3 g/dL (ref 30.0–36.0)
MCV: 91.2 fL (ref 78.0–100.0)
PLATELETS: 278 10*3/uL (ref 150–400)
RBC: 2.83 MIL/uL — ABNORMAL LOW (ref 4.22–5.81)
RDW: 18.3 % — ABNORMAL HIGH (ref 11.5–15.5)
WBC: 3.2 10*3/uL — AB (ref 4.0–10.5)

## 2014-02-25 MED ORDER — POTASSIUM CHLORIDE CRYS ER 20 MEQ PO TBCR: 20.0000 meq | EXTENDED_RELEASE_TABLET | Freq: Every day | ORAL | Status: DC

## 2014-02-25 MED ORDER — PREDNISONE 10 MG PO TABS: ORAL_TABLET | ORAL | Status: DC

## 2014-02-25 MED ORDER — HYDROCODONE-ACETAMINOPHEN 5-325 MG PO TABS: 1.0000 | ORAL_TABLET | Freq: Four times a day (QID) | ORAL | Status: DC | PRN

## 2014-02-25 MED ORDER — MAGNESIUM HYDROXIDE 400 MG/5ML PO SUSP: 15.0000 mL | Freq: Every day | ORAL | Status: AC

## 2014-02-25 MED ORDER — THIAMINE HCL 100 MG PO TABS: 100.0000 mg | ORAL_TABLET | Freq: Every day | ORAL | Status: AC

## 2014-02-25 MED ORDER — METOPROLOL SUCCINATE 12.5 MG HALF TABLET: 12.5000 mg | ORAL_TABLET | Freq: Every day | ORAL | Status: DC

## 2014-02-25 MED ORDER — METOPROLOL SUCCINATE ER 25 MG PO TB24
12.5000 mg | ORAL_TABLET | Freq: Every day | ORAL | Status: DC
  Administered 2014-02-25: 12.5 mg via ORAL
  Filled 2014-02-25: qty 1

## 2014-02-25 MED ORDER — PANTOPRAZOLE SODIUM 40 MG PO TBEC: 40.0000 mg | DELAYED_RELEASE_TABLET | Freq: Every day | ORAL | Status: DC

## 2014-02-25 MED ORDER — DICLOFENAC SODIUM 1 % TD GEL: 2.0000 g | Freq: Three times a day (TID) | TRANSDERMAL | Status: DC

## 2014-02-25 NOTE — Discharge Summary (Signed)
Physician Discharge Summary  Jacob Riddle XBJ:[id number] DOB: 1941/04/20 DOA: 02/18/2014  PCP: Catalina Pizza, MD  Admit date: 02/18/2014 Discharge date: 02/25/2014  Time spent: Greater than 30 minutes  Recommendations for Outpatient Follow-up:  1. The patient will need his Foley catheter capped in until he is reevaluated by urology. 2. Recommend followup evaluation of his serum potassium and hemoglobin/hematocrit. 3. Patient is being discharged to the [facility] for reconditioning/rehabilitation.   Discharge Diagnoses:   1. Generalized joint swelling and pain secondary to gout exacerbation. 2. Generalized edema, secondary to hypoalbuminemia, mostly resolved. 3. Acute renal failure, secondary to prerenal azotemia. 4. Urinary retention. 5. Gross hematuria. 6. Acute blood loss anemia superimposed on chronic anemia secondary to hematuria. 7. Metabolic acidosis secondary to acute renal failure. Resolved. 8. Chronic left lower extremity DVT. 9. Generalized weakness with failure to thrive in adult. 10. Hypertension. 11. One episode of nonsustained ventricular tachycardia. 12. Leukopenia, likely secondary to alcohol abuse. 13. Alcohol abuse. 14. Radiographic findings of cholelithiasis without cholecystitis. 15. Constipation. 16. Hypokalemia.   Discharge Condition: Improved but chronically debilitated.  Diet recommendation: Regular diet as tolerated  Filed Weights   02/19/14 0037  Weight: 76.522 kg (168 lb 11.2 oz)    History of present illness:   Jacob Riddle is a 73 y.o. male with a past medical history of alcohol dependence, who was sent over from skilled nursing facility with complaints of ankle pain and swelling and pain and swelling of his other joints. Patient was discharged from [facility] on 02/16/14 after being managed for alcohol withdrawal syndrome. He was noted to have bilateral lower extremity weakness, which was attributed to failure to thrive. He underwent MRI of his  thoracic and lumbar spine at that time, which revealed non specific findings. These findings were discussed with the neurosurgeon at that time and not felt to contribute to his symptoms. Patient was a very poor historian.  Not much history was available from him at the time of the initial assessment. He did tell Dr. Elvera Lennox that he had generalized weakness and that he was hurting all over. Denied any falls or injuries prior to admission. Skilled nursing facility was reluctant to take him back due to the swelling and his poor functional status.   Hospital Course:   1. Generalized joint swelling and pain of multiple joints including shoulders, hands, and feet, secondary to gout exacerbation. The patient was started on opiate analgesics as needed for pain. A uric acid level was ordered and it was elevated at 9.7. His symptomatology was thought to be secondary to acute gout exacerbation superimposed on chronic degenerative joint disease. Prednisone was started and tapered down to 10 mg daily. His symptoms abated, but did not completely resolve. Therefore, Voltaren gel was added to help with pain relief. He was discharged on 10 more days of low-dose prednisone and Voltaren gel 3 times a day.  2. Generalized edema. His generalized edema was thought to be secondary to hypoalbuminemia. He was given IV Lasix in the emergency department, although, it it was believed that he was intravascularly depleted on admission. He was hydrated gently. Following hydration, IV fluids were discontinued. He was given an additional dose of IV Lasix for some increase in ankle edema. 2-D echocardiogram was ordered and it revealed an ejection fraction of 65%. There was no obvious evidence of diastolic dysfunction. At the time of discharge, his edema had subsided.  3. DVT right lower extremity. Because of the swelling in his right greater than left legs, venous  ultrasound of his leg was ordered. The results revealed a chronic DVT in  the right lower extremity. Dr.Gherghe discussed the case with interventional radiologist, Dr. Fredia Sorrow because it was thought that the patient may need an IVC filter and no anticoagulation because of his gross hematuria. Dr. Fredia Sorrow reviewed the findings and felt that the clot appeared to be chronic in nature and nonobstructive. There was no real indication for starting anti-coagulation and less there was evidence of PE. Because the patient was at risk of a PE, a CT angiogram was ordered. It was negative for PE. Therefore, it was felt that there was no indication for placement of an IVC filter.  4. Urinary retention/obstructive uropathy. The patient was noted to have difficulty urinating. A Foley catheter was placed. It drained bloody urine. Urology was consulted. They recommended medical management and obtaining a CT of the abdomen and pelvis for further evaluation. The CT revealed no hydronephrosis, no focal renal abnormality, a bladder wall that was thickened and a minimally enlarged prostate. It also revealed constipation. He was treated appropriately. The Foley catheter was removed for a voiding trial, but the patient failed. The Foley catheter was reinserted. He will followup with urology as an outpatient.  5. Gross hematuria in the setting of indwelling Foley catheter and UTI. The patient was noted to have gross hematuria. A urinalysis and urine culture were ordered. He was started on Rocephin empirically. The urine culture was negative. He received 7 days of IV Rocephin during hospitalization. Antibiotic therapy was discontinued upon discharge. Gross hematuria resolved.  6. Acute on chronic anemia. The patient was transfused one unit of packed red blood cells on 7/20 when his hemoglobin decreased. His hemoglobin has remained stable over the last 24 hours and is 8.6 at the time of discharge. Further management and further evaluation will be deferred to his primary care physician.  7.  Leukopenia. The patient's WBC range from 2.7-4.0. The leukopenia was thought to be secondary to his history of alcohol abuse. His HIV antibody was nonreactive. His TSH was within normal limits at 2.8. His vitamin B12 level was within normal limits at 369. Recommend continue monitoring in the outpatient setting.  8. Acute renal failure. The acute renal failure was likely secondary to a prerenal azotemia, with possible contribution from obstructive uropathy. With IV fluid hydration and other treatments above, his creatinine decreased from 2.0-0.76 at the time of discharge.  9. Hypertension. Amlodipine was withheld for several days until his blood pressure started trending up. It was restarted. Due to an episode of nonsustained ventricular tachycardia, small dosing of Toprol XL was started. Continue monitoring in the outpatient setting.  10. Alcohol abuse. The patient was treated with vitamin therapy. There were no signs of alcohol withdrawal syndrome.  11. Hypokalemia. Supplemented and repleted.      Procedures: 2-D echocardiogram 7/22:Study Conclusions  - Left ventricle: Very poor image quality Possible small outflow gradient from hyperdynamic LV but no SAM seen Possible diastolic relaxation but incomplete diastolic evaluation The cavity size was normal. Systolic function was vigorous. The estimated ejection fraction was in the range of 65% to 70%. Wall motion was normal; there were no regional wall motion abnormalities. - Atrial septum: There was increased thickness of the septum, consistent with lipomatous hypertrophy.   Consultations:  Urology  Discharge Exam: Filed Vitals:   02/25/14 0712  BP: 159/52  Pulse: 72  Temp: 97.8 F (36.6 C)  Resp: 20   GEN: Debilitated-appearing 73 year old man sitting up in bed, in  no acute distress. CV: S1, S2, with a soft systolic murmur. Lungs: Rare crackles in the bases, breathing is nonlabored. Ext: Less bilateral upper extremity edema  with severe hypertrophic arthritic changes in his hands and trace to 1+ bilateral lower extremity edema. Patient has limited range of motion of left greater than right shoulder. Abdomen: Positive bowel sounds, soft, nontender, nondistended. Neurologic: He is alert and oriented x2. His speech is clear. Cranial nerves II through XII appear to be grossly intact. Skin: Superficial ulcerations on both feet, covered with appropriate dressing.   Discharge Instructions You were cared for by a hospitalist during your hospital stay. If you have any questions about your discharge medications or the care you received while you were in the hospital after you are discharged, you can call the unit and asked to speak with the hospitalist on call if the hospitalist that took care of you is not available. Once you are discharged, your primary care physician will handle any further medical issues. Please note that NO REFILLS for any discharge medications will be authorized once you are discharged, as it is imperative that you return to your primary care physician (or establish a relationship with a primary care physician if you do not have one) for your aftercare needs so that they can reassess your need for medications and monitor your lab values.  Discharge Instructions   Diet - low sodium heart healthy    Complete by:  As directed      Discharge instructions    Complete by:  As directed   Foley catheter is to be left in and managed at the skilled nursing facility until he is reevaluated by urology.     Discharge wound care:    Complete by:  As directed   Continue bandages on the ulcerations on both feet.     Increase activity slowly    Complete by:  As directed             Medication List         acetaminophen 325 MG tablet  Commonly known as:  TYLENOL  Take 2 tablets (650 mg total) by mouth every 8 (eight) hours as needed for mild pain.     amLODipine 5 MG tablet  Commonly known as:  NORVASC  Take 1  tablet (5 mg total) by mouth daily.     diclofenac sodium 1 % Gel  Commonly known as:  VOLTAREN  Apply 2 g topically 3 (three) times daily. Apply to joints in hands, feet, and shoulders.     feeding supplement (ENSURE COMPLETE) Liqd  Take 237 mLs by mouth 2 (two) times daily between meals.     HYDROcodone-acetaminophen 5-325 MG per tablet  Commonly known as:  NORCO/VICODIN  Take 1 tablet by mouth every 6 (six) hours as needed for moderate pain.     magnesium hydroxide 400 MG/5ML suspension  Commonly known as:  MILK OF MAGNESIA  Take 15 mLs by mouth daily.     metoprolol succinate 12.5 mg Tb24 24 hr tablet  Commonly known as:  TOPROL-XL  Take 0.5 tablets (12.5 mg total) by mouth daily.     multivitamin capsule  Take 1 capsule by mouth daily.     pantoprazole 40 MG tablet  Commonly known as:  PROTONIX  Take 1 tablet (40 mg total) by mouth daily.     potassium chloride SA 20 MEQ tablet  Commonly known as:  K-DUR,KLOR-CON  Take 1 tablet (20 mEq total) by mouth  daily. Give for one more week.     predniSONE 10 MG tablet  Commonly known as:  DELTASONE  Prednisone 10 mg daily for 5 days; then 5 mg daily for 5 days; then stop.     thiamine 100 MG tablet  Take 1 tablet (100 mg total) by mouth daily.       Allergies  Allergen Reactions  . Lisinopril Other (See Comments)    Dizziness       Follow-up Information   Follow up with Anner Crete, MD On 03/02/2014. (at 10:00 am  at the [facility] office)    Specialty:  Urology   Contact information:   [address] [place] [address] [phone]        The results of significant diagnostics from this hospitalization (including imaging, microbiology, ancillary and laboratory) are listed below for reference.    Significant Diagnostic Studies: Ct Abdomen Pelvis W Wo Contrast  02/23/2014   CLINICAL DATA:  Pain.  EXAM: CT ANGIOGRAPHY CHEST  CT ABDOMEN AND PELVIS WITH CONTRAST  TECHNIQUE: Multidetector CT imaging of the  chest was performed using the standard protocol during bolus administration of intravenous contrast. Multiplanar CT image reconstructions and MIPs were obtained to evaluate the vascular anatomy. Multidetector CT imaging of the abdomen and pelvis was performed using the standard protocol during bolus administration of intravenous contrast.  CONTRAST:  OMNIPAQUE IOHEXOL 300 MG/ML  SOLN  COMPARISON:  Ultrasound 02/19/2014, MRI 02/12/2014 P  FINDINGS: CTA CHEST FINDINGS  Thoracic aorta is atherosclerotic. No aneurysm or dissection. Coronary artery disease. Heart size normal. Pulmonary arteries are normal. No evidence of pulmonary embolus.  No significant mediastinal adenopathy. Thoracic esophagus is unremarkable.  Large airways are patent. Right base atelectasis and/or infiltrate.  Thyroid is unremarkable.  No significant adenopathy.  CT ABDOMEN and PELVIS FINDINGS  Liver normal. Mild splenomegaly. Splenosis. Pancreas is normal. No biliary distention. The gallbladder wall is slightly thickened in most likely contains sludge and/or small stones could. Cholelithiasis with cholecystitis cannot be excluded.  Adrenals are normal. No focal renal abnormality. No hydronephrosis. Foley catheter present in the bladder. The bladder wall is thickened. Active bladder pathology cannot be excluded. Prostate is minimally enlarged.  No significant adenopathy. Dense aortoiliac atherosclerotic vascular disease and visceral atherosclerotic vascular disease is present. Renal artery atherosclerotic vascular disease present. There is symmetric renal perfusion. No aneurysm.  Appendix normal. Large amount of stool in colon. Sigmoid colonic diverticulosis. No evidence of diverticulitis. No bowel distention or free air. No mesenteric mass.  Disc space loss with endplate erosions noted L1-L2 and L3-L4. This suggests possibility of discitis.  Review of the MIP images confirms the above findings.  IMPRESSION: 1. No evidence pulmonary embolus.   Coronary artery disease. 2. Right lung base atelectasis and or mild infiltrate.  3. Thickened gallbladder wall with probable sludge and/or small stones. Cholecystitis cannot be excluded. 4. Cannot exclude discitis at L1-L2 and L3-L4. 5. Mild splenomegaly.   Electronically Signed   By: Maisie Fus  Register   On: 02/23/2014 11:15   Dg Lumbar Spine Complete  02/12/2014   CLINICAL DATA:  Low back pain after falling out of bed 2 days ago.  EXAM: LUMBAR SPINE - COMPLETE 4+ VIEW  COMPARISON:  None.  FINDINGS: There is no fracture. Mild convex right scoliosis is noted. The patient has advanced multilevel facet and degenerative disc disease. 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  IMPRESSION: No acute finding.  Scoliosis and marked multilevel degenerative change.  Atherosclerosis.   Electronically  Signed   By: Drusilla Kannerhomas  Dalessio M.D.   On: 02/12/2014 14:28   Ct Head Wo Contrast  02/12/2014   CLINICAL DATA:  Fall.  Weakness.  EXAM: CT HEAD WITHOUT CONTRAST  TECHNIQUE: Contiguous axial images were obtained from the base of the skull through the vertex without intravenous contrast.  COMPARISON:  CT head without contrast 12/29/2003  FINDINGS: There is significant interval progression of diffuse atrophy and white matter disease. 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 No acute cortical infarct, hemorrhage, or mass lesion is present. Ventricular enlargement is proportionate to the degree of atrophy.  The paranasal sinuses and mastoid air cells are clear. The osseous skull is intact. Mild soft tissue swelling is noted above the right orbit. There is no underlying fracture.  IMPRESSION: 1. Significant interval progression of generalized atrophy and white matter disease. This is compatible with progressive chronic microvascular ischemia. 2. 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 3. No acute intracranial abnormality. 4. Question soft swelling in the right supraorbital  scalp without an underlying fracture.   Electronically Signed   By: Gennette Pachris  Mattern M.D.   On: 02/12/2014 14:37   Ct Angio Chest Pe W/cm &/or Wo Cm  02/23/2014   CLINICAL DATA:  Pain.  EXAM: CT ANGIOGRAPHY CHEST  CT ABDOMEN AND PELVIS WITH CONTRAST  TECHNIQUE: Multidetector CT imaging of the chest was performed using the standard protocol during bolus administration of intravenous contrast. Multiplanar CT image reconstructions and MIPs were obtained to evaluate the vascular anatomy. Multidetector CT imaging of the abdomen and pelvis was performed using the standard protocol during bolus administration of intravenous contrast.  CONTRAST:  100mL OMNIPAQUE IOHEXOL 300 MG/ML  SOLN  COMPARISON:  Ultrasound 02/19/2014, MRI 02/12/2014 P  FINDINGS: CTA CHEST FINDINGS  Thoracic aorta is atherosclerotic. No aneurysm or dissection. Coronary artery disease. Heart size normal. Pulmonary arteries are normal. No evidence of pulmonary embolus.  No significant mediastinal adenopathy. Thoracic esophagus is unremarkable.  Large airways are patent. Right base atelectasis and/or infiltrate.  Thyroid is unremarkable.  No significant adenopathy.  CT ABDOMEN and PELVIS FINDINGS  Liver normal. Mild splenomegaly. Splenosis. Pancreas is normal. No biliary distention. The gallbladder wall is slightly thickened in most likely contains sludge and/or small stones could. Cholelithiasis with cholecystitis cannot be excluded.  Adrenals are normal. No focal renal abnormality. No hydronephrosis. Foley catheter present in the bladder. The bladder wall is thickened. Active bladder pathology cannot be excluded. Prostate is minimally enlarged.  No significant adenopathy. Dense aortoiliac atherosclerotic vascular disease and visceral atherosclerotic vascular disease is present. Renal artery atherosclerotic vascular disease present. There is symmetric renal perfusion. No aneurysm.  Appendix normal. Large amount of stool in colon. Sigmoid colonic  diverticulosis. No evidence of diverticulitis. No bowel distention or free air. No mesenteric mass.  Disc space loss with endplate erosions noted L1-L2 and L3-L4. This suggests possibility of discitis.  Review of the MIP images confirms the above findings.  IMPRESSION: 1. No evidence pulmonary embolus.  Coronary artery disease. 2. Right lung base atelectasis and or mild infiltrate.  3. Thickened gallbladder wall with probable sludge and/or small stones. Cholecystitis cannot be excluded. 4. Cannot exclude discitis at L1-L2 and L3-L4. 5. Mild splenomegaly.   Electronically Signed   By: Maisie Fushomas  Register   On: 02/23/2014 11:15   Mr Thoracic Spine Wo Contrast  02/12/2014   ADDENDUM REPORT: 02/12/2014 19:56  ADDENDUM: There is a speech recognition error. In the body of the report, third  paragraph, first line,  the sentence should read:  At T6-7,.   Electronically Signed   By: Davonna Belling M.D.   On: 02/12/2014 19:56   02/12/2014   CLINICAL DATA:  Bilateral leg weakness with inability to walk. History of a fall. History of alcoholism.  EXAM: MRI THORACIC SPINE WITHOUT CONTRAST  TECHNIQUE: Multiplanar, multisequence MR imaging of the thoracic spine was performed. No intravenous contrast was administered.  COMPARISON:  None.  FINDINGS: Numbering of the thoracic spine was accomplished by counting down from the odontoid. There is chronic disc space narrowing in the thoracic spine most notable at T5-T6. No intraspinal mass lesions or hematomas are evident. There is no worrisome osseous lesion. There is no compression fracture or traumatic subluxation. Paravertebral soft tissues unremarkable.  The axial images are significantly degraded by motion and accurate characterization is difficult. At T5-6, there is a shallow protrusion in the midline. On this motion degraded exam, the cord appears flattened with mild increased signal.  At C6-7, there is a central and leftward protrusion. On this motion degraded examination, the cord  appears mildly flattened with disc material extending into the foramen on the left. There may be mild increased cord signal.  IMPRESSION: Disc protrusions T5-6 and T6-7 with mild cord flattening at both levels. Mild increased cord signal is suspected.  It is unclear if the observed findings are acute, and/or sufficiently severe to result myelopathy. Clinical correlation recommended.  No obvious posttraumatic deformities such as compression fracture or spinal hematoma.  Electronically Signed: By: Davonna Belling M.D. On: 02/12/2014 19:46   Mr Lumbar Spine Wo Contrast  02/12/2014   CLINICAL DATA:  Low back pain.  Inability to walk.  Alcoholism.  EXAM: MRI LUMBAR SPINE WITHOUT CONTRAST  TECHNIQUE: Multiplanar, multisequence MR imaging of the lumbar spine was performed. No intravenous contrast was administered.  COMPARISON:  Plain films earlier today.  FINDINGS: No lumbar compression deformity or intraspinal hematoma. Normal conus. Advanced endplate reactive changes above and below L1-2 and L3-4, associated with this a scans in the oblique these are not worrisome based on the plain film appearance of chronic degenerative change, although in the appropriate clinical setting, with fever and elevated white count, this appearance could be associated with disc space infection. I feel this is unlikely however. Correlate clinically. Trace retrolisthesis L3-4 and L4-5 is facet mediated. No occult sacral fracture. Paravertebral and some was muscles appear fatty replaced, suggesting in activity. No aortic or renal abnormalities are evident.  Axial images are degraded by motion.  At L1-2, there is mild annular bulging without impingement. Slight lateral recess stenosis on the left relates to asymmetric loss of interspace height on that side.  At L2-L3 there is mild facet arthropathy and annular bulging without impingement.  At L3-L4, there is asymmetric loss of interspace height greater on the right with spurring along with mild  facet arthropathy. Annular bulging is noted. No impingement.  At L4-5 there is a central protrusion without significant stenosis. Mild facet arthropathy without impingement.  Shallow protrusion at L5-S1 without impingement. Mild facet arthropathy.  IMPRESSION: Multilevel spondylosis as described. No areas of critical spinal stenosis, significant malalignment, or compression deformity.  Discal hyperintensity at L1-2 and L3-4 associated with marked endplate reactive changes are felt to be degenerative in nature. See discussion above.   Electronically Signed   By: Davonna Belling M.D.   On: 02/12/2014 20:06   US Abdomen Complete  02/19/2014   CLINICAL DATA:  Abnormal liver function tests, acute renal  failure  EXAM: ULTRASOUND ABDOMEN COMPLETE  COMPARISON:  None.  FINDINGS: Gallbladder:  Dependent sludge, clot, or other debris is noted within the gallbladder. Gallbladder wall thickening measuring 7 mm is noted. No reported sonographic Murphy's side. No pericholecystic fluid.  Common bile duct:  Diameter: 3 mm  Liver:  Increased echogenicity with nodular contour but no focal abnormality or intrahepatic ductal dilatation.  IVC:  Obscured by overlying bowel gas and increased hepatic echogenicity  Pancreas:  Visualized portion unremarkable.  Spleen:  Size and appearance within normal limits.  Right Kidney:  Length: 12.5 cm. Cortical thinning and increased echogenicity without hydronephrosis or focal mass.  Left Kidney:  Length: 11.8 cm. Cortical thinning and increased echogenicity without hydronephrosis or focal mass.  Abdominal aorta:  No aneurysm visualized.  Other findings:  None.  IMPRESSION: Dependent sludge, clot, or other debris within the gallbladder with gallbladder wall thickening. This could indicate acute cholecystitis in the appropriate clinical context, with metastatic or primary neoplastic mass significantly less likely in the absence of underlying known malignancy.  Bilateral renal cortical thinning and  increased echogenicity, compatible with medical renal disease.   Electronically Signed   By: Christiana Pellant M.D.   On: 02/19/2014 15:38   US Pelvis Limited  02/19/2014   CLINICAL DATA:  Bladder distention, acute renal failure  EXAM: US PELVIS LIMITED  TECHNIQUE: Ultrasound examination of the pelvic soft tissues was performed in the area of clinical concern.  COMPARISON:  No recent similar comparison  FINDINGS: A Foley catheter is in place and the bladder is decompressed. The mucosa appears redundant and a small mass could be obscured by lack of distention by urine.  IMPRESSION: Bladder decompression with probable mucosal infolding.   Electronically Signed   By: Christiana Pellant M.D.   On: 02/19/2014 15:42   US Venous Img Lower Bilateral  02/19/2014   CLINICAL DATA:  Bilateral lower extremity pain and swelling. History of smoking. Evaluate for DVT.  EXAM: BILATERAL LOWER EXTREMITY VENOUS DOPPLER ULTRASOUND  TECHNIQUE: Gray-scale sonography with graded compression, as well as color Doppler and duplex ultrasound were performed to evaluate the lower extremity deep venous systems from the level of the common femoral vein and including the common femoral, femoral, profunda femoral, popliteal and calf veins including the posterior tibial, peroneal and gastrocnemius veins when visible. The superficial great saphenous vein was also interrogated. Spectral Doppler was utilized to evaluate flow at rest and with distal augmentation maneuvers in the common femoral, femoral and popliteal veins.  COMPARISON:  None.  FINDINGS: The examination is degraded due to patient body habitus and poor sonographic window.  RIGHT LOWER EXTREMITY  The right common and deep femoral veins are widely patent. The right greater saphenous vein is patent proximally.  There is mixed echogenic occlusive thrombus throughout the right femoral vein extending through the right popliteal vein and into the imaged portions of the tibial veins.  LEFT LOWER  EXTREMITY  The left common, superficial femoral, deep femoral and popliteal veins are widely patent. The tibial veins are not well visualized.  Other Findings:  None.  IMPRESSION: 1. Examination is positive for age-indeterminate occlusive thrombus throughout the right femoral and popliteal veins extending into the imaged portions of the right tibial veins. 2. No definite evidence of DVT within the left lower extremity, though note, the tibial veins of the left lower leg are not well visualized. These results will be called to the ordering clinician or representative by the Radiologist Assistant, and communication documented in the  PACS or zVision Dashboard.   Electronically Signed   By: Simonne Come M.D.   On: 02/19/2014 16:00   Dg Chest Portable 1 View  02/18/2014   CLINICAL DATA:  Shortness of breath/edema.  Smoker.  EXAM: PORTABLE CHEST - 1 VIEW  COMPARISON:  02/12/2014  FINDINGS: Lungs are adequately inflated without consolidation or effusion. Old left humeral neck fracture. The cardiomediastinal silhouette and remainder of the exam is unchanged.  IMPRESSION: No active disease.   Electronically Signed   By: Elberta Fortis M.D.   On: 02/18/2014 17:00   Dg Chest Port 1 View  02/12/2014   CLINICAL DATA:  Fall, cough, weakness  EXAM: PORTABLE CHEST - 1 VIEW  COMPARISON:  Portal chest radiograph 09/11/2011  FINDINGS: The heart size and mediastinal contours are within normal limits. Atherosclerotic calcifications in the aorta. Both lungs are clear. The visualized skeletal structures are unremarkable.  IMPRESSION: No active disease.   Electronically Signed   By: Salome Holmes M.D.   On: 02/12/2014 13:25   Dg Shoulder Left  02/12/2014   CLINICAL DATA:  Left shoulder pain status post fall 2 days ago.  EXAM: LEFT SHOULDER - 2+ VIEW  COMPARISON:  Left shoulder series of October 25, 2011  FINDINGS: The patient had sustained a comminuted angulated subcapital fracture of the left shoulder in February of 2013. The  fracture has healed with residual deformity. No acute fracture is demonstrated. There is minimal degenerative change of the glenohumeral and of the AC joints. The left clavicle and observed left ribs are normal. There is old deformity of the posterior lateral aspect of the left seventh rib.  IMPRESSION: There is no acute fracture of the left shoulder. There is chronic deformity related to the patient's previous fracture.   Electronically Signed   By: David  [place]   On: 02/12/2014 15:28    Microbiology: Recent Results (from the past 240 hour(s))  URINE CULTURE     Status: None   Collection Time    02/19/14  6:41 AM      Result Value Ref Range Status   Specimen Description URINE, CATHETERIZED   Final   Special Requests NONE   Final   Culture  Setup Time     Final   Value: 02/19/2014 13:18     Performed at [facility]   Colony Count     Final   Value: NO GROWTH     Performed at [facility]   Culture     Final   Value: NO GROWTH     Performed at [facility]   Report Status 02/20/2014 FINAL   Final     Labs: Basic Metabolic Panel:  Recent Labs Lab 02/19/14 1914  02/21/14 0545 02/22/14 0915 02/23/14 0625 02/24/14 0557 02/25/14 0525  NA 135*  < > 137 133* 135* 136* 134*  K 3.9  < > 2.5* 3.1* 3.5* 3.4* 4.1  CL 100  < > 102 100 102 101 100  CO2 17*  < > 20 19 20 19 23   GLUCOSE 104*  < > 101* 105* 98 102* 108*  BUN 56*  < > 29* 18 14 15 14   CREATININE 2.10*  < > 1.00 0.82 0.70 0.79 0.76  CALCIUM 8.9  < > 8.1* 8.2* 8.2* 8.3* 8.2*  MG 1.7  --  1.4*  --  1.5 1.6 1.8  PHOS  --   --  3.8  --  3.1 3.4  --   < > =  values in this interval not displayed. Liver Function Tests:  Recent Labs Lab 02/21/14 0545 02/22/14 0915 02/23/14 0625 02/24/14 0557 02/25/14 0525  AST 74* 89* 103* 89* 69*  ALT 20 25 31  32 27  ALKPHOS 138* 142* 127* 126* 118*  BILITOT 0.4 0.5 0.4 0.5 0.3  PROT 6.0 6.5 6.3 6.5 6.4  ALBUMIN 1.7* 1.8* 1.7* 1.9* 1.9*   No results found  for this basename: LIPASE, AMYLASE,  in the last 168 hours No results found for this basename: AMMONIA,  in the last 168 hours CBC:  Recent Labs Lab 02/22/14 0915 02/23/14 0625 02/24/14 0557 02/24/14 0818 02/24/14 1653 02/25/14 0525  WBC 3.9* 2.9* 3.7*  --  2.7* 3.2*  HGB 8.6* 8.0* 6.4* 9.0* 8.4* 8.6*  HCT 25.6* 23.9* 19.6* 27.2* 25.6* 25.8*  MCV 90.8 90.9 91.2  --  90.8 91.2  PLT 235 234 317  --  238 278   Cardiac Enzymes:  Recent Labs Lab 02/19/14 0851  CKTOTAL 153   BNP: BNP (last 3 results) No results found for this basename: PROBNP,  in the last 8760 hours CBG: No results found for this basename: GLUCAP,  in the last 168 hours     Signed:  Yannick Steuber  [facility] 02/25/2014, 10:58 AM

## 2014-02-25 NOTE — Progress Notes (Signed)
UR chart review completed.  

## 2014-02-25 NOTE — Clinical Social Work Note (Signed)
Pt d/c today to [facility] [place]. Pt and Chris at facility aware and agreeable. CSW attempted to reach contact, [place] but number is not accurate. [facility]. Will fax d/c summary upon completion.  Derenda FennelKara Nicolet Griffy, [place] [phone]

## 2014-02-25 NOTE — Clinical Social Work Placement (Signed)
Clinical Social Work Department CLINICAL SOCIAL WORK PLACEMENT NOTE 02/25/2014  Patient:  Rhona LeavensMCCUISTON,Tashon  Account Number:  [id number] Admit date:  02/18/2014  Clinical Social Worker:  Derenda FennelKARA Elyna Pangilinan, LCSW  Date/time:  02/19/2014 08:45 AM  Clinical Social Work is seeking post-discharge placement for this patient at the following level of care:   SKILLED NURSING   (*CSW will update this form in Epic as items are completed)   02/19/2014  Patient/family provided with [facility] Department of Clinical Social Work's list of facilities offering this level of care within the geographic area requested by the patient (or if unable, by the patient's family).  02/19/2014  Patient/family informed of their freedom to choose among providers that offer the needed level of care, that participate in Medicare, Medicaid or managed care program needed by the patient, have an available bed and are willing to accept the patient.  02/19/2014  Patient/family informed of [facility]' ownership interest in [facility], as well as of the fact that they are under no obligation to receive care at this facility.  PASARR submitted to EDS on  PASARR number received on   FL2 transmitted to all facilities in geographic area requested by pt/family on  02/19/2014 FL2 transmitted to all facilities within larger geographic area on   Patient informed that his/her managed care company has contracts with or will negotiate with  certain facilities, including the following:     Patient/family informed of bed offers received:  02/19/2014 [facility] [place] Physician recommends and patient chooses bed at  [facility]  Patient [facility] on  02/25/2014 Patient to be [facility] Patient and family notified of transfer on 02/25/2014 Name of family member notified:  number for contact not working  The  following physician request were entered in Epic:   Additional Comments: Pt has existing pasarr.  Derenda FennelKara Justen Fonda, [place] [phone]

## 2014-02-27 LAB — HCV RNA QUANT
HCV QUANT LOG: 6.87 {Log} — AB (ref <1.18)
HCV Quantitative: 7348037 IU/mL — ABNORMAL HIGH (ref <15)

## 2014-03-19 ENCOUNTER — Ambulatory Visit: Payer: Medicare Other | Admitting: Urology

## 2016-01-06 ENCOUNTER — Encounter (HOSPITAL_COMMUNITY): Payer: Self-pay | Admitting: Cardiology

## 2016-01-06 ENCOUNTER — Emergency Department (HOSPITAL_COMMUNITY)
Admission: EM | Admit: 2016-01-06 | Discharge: 2016-01-06 | Disposition: A | Discharge status: Home / Self Care | Payer: Medicare Other | Attending: Emergency Medicine | Admitting: Emergency Medicine

## 2016-01-06 DIAGNOSIS — M1990 Unspecified osteoarthritis, unspecified site: Secondary | ICD-10-CM | POA: Insufficient documentation

## 2016-01-06 DIAGNOSIS — I10 Essential (primary) hypertension: Secondary | ICD-10-CM

## 2016-01-06 DIAGNOSIS — F17200 Nicotine dependence, unspecified, uncomplicated: Secondary | ICD-10-CM | POA: Diagnosis not present

## 2016-01-06 DIAGNOSIS — Z79899 Other long term (current) drug therapy: Secondary | ICD-10-CM | POA: Diagnosis not present

## 2016-01-06 HISTORY — DX: Benign prostatic hyperplasia without lower urinary tract symptoms: N40.0

## 2016-01-06 HISTORY — DX: Anemia, unspecified: D64.9

## 2016-01-06 HISTORY — DX: Edema, unspecified: R60.9

## 2016-01-06 HISTORY — DX: Disorder of kidney and ureter, unspecified: N28.9

## 2016-01-06 HISTORY — DX: Hypokalemia: E87.6

## 2016-01-06 HISTORY — DX: Weakness: R53.1

## 2016-01-06 LAB — CBC WITH DIFFERENTIAL/PLATELET
BASOS ABS: 0 10*3/uL (ref 0.0–0.1)
Basophils Relative: 1 %
EOS ABS: 0.2 10*3/uL (ref 0.0–0.7)
EOS PCT: 3 %
HCT: 46.5 % (ref 39.0–52.0)
Hemoglobin: 15.3 g/dL (ref 13.0–17.0)
Lymphocytes Relative: 25 %
Lymphs Abs: 1.3 10*3/uL (ref 0.7–4.0)
MCH: 28.2 pg (ref 26.0–34.0)
MCHC: 32.9 g/dL (ref 30.0–36.0)
MCV: 85.6 fL (ref 78.0–100.0)
Monocytes Absolute: 0.3 10*3/uL (ref 0.1–1.0)
Monocytes Relative: 5 %
NEUTROS PCT: 66 %
Neutro Abs: 3.4 10*3/uL (ref 1.7–7.7)
PLATELETS: 155 10*3/uL (ref 150–400)
RBC: 5.43 MIL/uL (ref 4.22–5.81)
RDW: 15.2 % (ref 11.5–15.5)
WBC: 5.2 10*3/uL (ref 4.0–10.5)

## 2016-01-06 LAB — COMPREHENSIVE METABOLIC PANEL
ALT: 17 U/L (ref 17–63)
AST: 32 U/L (ref 15–41)
Albumin: 2.6 g/dL — ABNORMAL LOW (ref 3.5–5.0)
Alkaline Phosphatase: 126 U/L (ref 38–126)
Anion gap: 5 (ref 5–15)
BUN: 26 mg/dL — AB (ref 6–20)
CHLORIDE: 107 mmol/L (ref 101–111)
CO2: 25 mmol/L (ref 22–32)
CREATININE: 1.03 mg/dL (ref 0.61–1.24)
Calcium: 8.3 mg/dL — ABNORMAL LOW (ref 8.9–10.3)
GFR calc Af Amer: >60 mL/min (ref >60)
GFR calc non Af Amer: >60 mL/min (ref >60)
Glucose, Bld: 115 mg/dL — ABNORMAL HIGH (ref 65–99)
Potassium: 4.1 mmol/L (ref 3.5–5.1)
SODIUM: 137 mmol/L (ref 135–145)
Total Bilirubin: 0.6 mg/dL (ref 0.3–1.2)
Total Protein: 6.6 g/dL (ref 6.5–8.1)

## 2016-01-06 MED ORDER — LABETALOL HCL 5 MG/ML IV SOLN
20.0000 mg | Freq: Once | INTRAVENOUS | Status: AC
  Administered 2016-01-06: 20 mg via INTRAVENOUS
  Filled 2016-01-06: qty 4

## 2016-01-06 MED ORDER — METOPROLOL SUCCINATE ER 50 MG PO TB24: 50.0000 mg | ORAL_TABLET | Freq: Every day | ORAL | Status: AC

## 2016-01-06 NOTE — ED Notes (Signed)
Transport called.

## 2016-01-06 NOTE — Discharge Instructions (Signed)
Change the Toprol-XL 25 mg a day to Toprol-XL 50mg  a day.  Follow up with your md next week.

## 2016-01-06 NOTE — ED Notes (Addendum)
[facility] [place].  Pt had episode earlier with left arm weakness and hypertension.  Pt was asymptomatic when ems arrived and pt refused transport at that time.  After MD arrived at facility and seen pt,  Pt was still hypertensive and MD recommended come to the hospital.  B/p 230/90.  Per staff  clonidine patch was not on correctly,  Was just the covering for the patch. Pt had SL NTG at facility.

## 2016-01-06 NOTE — ED Provider Notes (Signed)
CSN: [id number]     Arrival date & time 01/06/16  1238 History  By signing my name below, I, Iona Beard, attest that this documentation has been prepared under the direction and in the presence of Bethann Berkshire, MD.   Electronically Signed: Iona Beard, ED Scribe. 01/06/2016. 1:11 PM   Chief Complaint  Patient presents with  . Hypertension    Patient is a 75 y.o. male presenting with hypertension. The history is provided by the patient. No language interpreter was used.  Hypertension This is a new problem. The current episode started 3 to 5 hours ago. The problem has not changed since onset.Pertinent negatives include no chest pain, no abdominal pain and no headaches. Nothing aggravates the symptoms. Nothing relieves the symptoms. He has tried nothing for the symptoms.   HPI Comments: Donterrius Santucci is a 75 y.o. male who presents to the Emergency Department complaining of gradual onset, hypertension, onset this morning at 10 AM. Pt reports associated dizziness with standing. No other associated symptoms noted. No worsening or alleviating factors noted. Pt denies any other pertinent symptoms. Per nursing note, pt had an episode of left arm weakness and hypertension earlier today at [facility] in [place]. Pt was evaluated by MD this morning and was told to come to the ED for further evaluation. Pt has a clonidine patch that was not being worn correctly.  Past Medical History  Diagnosis Date  . Arthritis   . Gout   . Hypertension   . Fracture     left arm  . Alcohol abuse   . Cholelithiasis 02/24/2014  . Acute blood loss anemia 7//2015    Hematuria  . Lower leg DVT (deep venous thromboembolism), chronic (HCC) 7//2015  . Hematuria, gross 02/24/2014  . Gout flare 02/24/2014  . Urinary retention 02/25/2014  . Anemia   . Edema   . Weakness   . Hypokalemia   . Renal disorder   . BPH (benign prostatic hyperplasia)    Past Surgical History  Procedure Laterality Date  .  Prostate biopsy    . Knee surgery Left    History reviewed. No pertinent family history. Social History  Substance Use Topics  . Smoking status: Current Every Day Smoker  . Smokeless tobacco: None  . Alcohol Use: Yes     Comment: none since 02/12/2014    Review of Systems  Constitutional: Negative for appetite change and fatigue.  HENT: Negative for congestion, ear discharge and sinus pressure.   Eyes: Negative for discharge.  Respiratory: Negative for cough.   Cardiovascular: Negative for chest pain.  Gastrointestinal: Negative for abdominal pain and diarrhea.  Genitourinary: Negative for frequency and hematuria.  Musculoskeletal: Negative for back pain.  Skin: Negative for rash.  Neurological: Positive for dizziness. Negative for seizures and headaches.  Psychiatric/Behavioral: Negative for hallucinations.     Allergies  Lisinopril and Other  Home Medications   Prior to Admission medications   Medication Sig Start Date End Date Taking? Authorizing Provider  acetaminophen (TYLENOL) 325 MG tablet Take 2 tablets (650 mg total) by mouth every 8 (eight) hours as needed for mild pain. 02/16/14   Costin Otelia Sergeant, MD  amLODipine (NORVASC) 5 MG tablet Take 1 tablet (5 mg total) by mouth daily. 02/16/14   Costin Otelia Sergeant, MD  diclofenac sodium (VOLTAREN) 1 % GEL Apply 2 g topically 3 (three) times daily. Apply to joints in hands, feet, and shoulders. 02/25/14   Elliot Cousin, MD  feeding supplement, ENSURE COMPLETE, (ENSURE COMPLETE)  LIQD Take 237 mLs by mouth 2 (two) times daily between meals. 02/16/14   Costin Otelia SergeantM Gherghe, MD  HYDROcodone-acetaminophen (NORCO/VICODIN) 5-325 MG per tablet Take 1 tablet by mouth every 6 (six) hours as needed for moderate pain. 02/25/14   Elliot Cousinenise Fisher, MD  magnesium hydroxide (MILK OF MAGNESIA) 400 MG/5ML suspension Take 15 mLs by mouth daily. 02/25/14   Elliot Cousinenise Fisher, MD  metoprolol succinate (TOPROL-XL) 12.5 mg TB24 24 hr tablet Take 0.5 tablets (12.5 mg  total) by mouth daily. 02/25/14   Elliot Cousinenise Fisher, MD  Multiple Vitamin (MULTIVITAMIN) capsule Take 1 capsule by mouth daily. 02/16/14   Costin Otelia SergeantM Gherghe, MD  pantoprazole (PROTONIX) 40 MG tablet Take 1 tablet (40 mg total) by mouth daily. 02/25/14   Elliot Cousinenise Fisher, MD  potassium chloride SA (K-DUR,KLOR-CON) 20 MEQ tablet Take 1 tablet (20 mEq total) by mouth daily. Give for one more week. 02/25/14   Elliot Cousinenise Fisher, MD  predniSONE (DELTASONE) 10 MG tablet Prednisone 10 mg daily for 5 days; then 5 mg daily for 5 days; then stop. 02/25/14   Elliot Cousinenise Fisher, MD  thiamine 100 MG tablet Take 1 tablet (100 mg total) by mouth daily. 02/25/14   Elliot Cousinenise Fisher, MD   BP 255/62 mmHg  Pulse 67  Temp(Src) 98.1 F (36.7 C) (Oral)  Resp 20  Ht 6' (1.829 m)  Wt 180 lb (81.647 kg)  BMI 24.41 kg/m2  SpO2 98% Physical Exam  Constitutional: He is oriented to person, place, and time. He appears well-developed.  HENT:  Head: Normocephalic.  Eyes: Conjunctivae and EOM are normal. No scleral icterus.  Neck: Neck supple. No thyromegaly present.  Cardiovascular: Normal rate and regular rhythm.  Exam reveals no gallop and no friction rub.   No murmur heard. Pulmonary/Chest: No stridor. He has no wheezes. He has rales. He exhibits no tenderness.  Mild crackles bilaterally.  Abdominal: He exhibits no distension. There is no tenderness. There is no rebound.  Musculoskeletal: Normal range of motion. He exhibits edema.  1+ pitting edema bilaterally.  Lymphadenopathy:    He has no cervical adenopathy.  Neurological: He is oriented to person, place, and time. He exhibits normal muscle tone. Coordination normal.  Skin: No rash noted. No erythema.  Psychiatric: He has a normal mood and affect. His behavior is normal.    ED Course  Procedures (including critical care time) DIAGNOSTIC STUDIES: Oxygen Saturation is 98% on RA, normal by my interpretation.    COORDINATION OF CARE: 1:11 PM Discussed treatment plan with pt at  bedside and pt agreed to plan.   Labs Review Labs Reviewed - No data to display  Imaging Review No results found. I have personally reviewed and evaluated these images and lab results as part of my medical decision-making.   EKG Interpretation None     CRITICAL CARE Performed by: Nailyn Dearinger L Total critical care time:7035minutes Critical care time was exclusive of separately billable procedures and treating other patients. Critical care was necessary to treat or prevent imminent or life-threatening deterioration. Critical care was time spent personally by me on the following activities: development of treatment plan with patient and/or surrogate as well as nursing, discussions with consultants, evaluation of patient's response to treatment, examination of patient, obtaining history from patient or surrogate, ordering and performing treatments and interventions, ordering and review of laboratory studies, ordering and review of radiographic studies, pulse oximetry and re-evaluation of patient's condition.  MDM   Final diagnoses:  None   Patient with high blood pressure poorly  controlled. He was given Normodyne in the ER which helped his blood pressure. Patient is given a new clonidine patch today. And also we are increasing his Toprol 25 mg XL to 50 mg XL. He'll follow-up with Dr. next week  The chart was scribed for me under my direct supervision.  I personally performed the history, physical, and medical decision making and all procedures in the evaluation of this patient.Bethann Berkshire, MD 01/06/16 [id number]

## 2016-01-06 NOTE — ED Notes (Signed)
Dr Clarene DukeMcmanus notified of BP 215/58. Transportation in  To pick pt up . Left arm checked BP 138/80

## 2016-01-15 ENCOUNTER — Emergency Department (HOSPITAL_COMMUNITY)
Admission: EM | Admit: 2016-01-15 | Discharge: 2016-01-15 | Disposition: A | Discharge status: Home / Self Care | Payer: Medicare Other | Attending: Emergency Medicine | Admitting: Emergency Medicine

## 2016-01-15 ENCOUNTER — Encounter (HOSPITAL_COMMUNITY): Payer: Self-pay | Admitting: *Deleted

## 2016-01-15 ENCOUNTER — Emergency Department (HOSPITAL_COMMUNITY): Payer: Medicare Other

## 2016-01-15 DIAGNOSIS — I10 Essential (primary) hypertension: Secondary | ICD-10-CM | POA: Diagnosis not present

## 2016-01-15 DIAGNOSIS — Z8679 Personal history of other diseases of the circulatory system: Secondary | ICD-10-CM | POA: Diagnosis not present

## 2016-01-15 DIAGNOSIS — R251 Tremor, unspecified: Secondary | ICD-10-CM | POA: Insufficient documentation

## 2016-01-15 DIAGNOSIS — M1990 Unspecified osteoarthritis, unspecified site: Secondary | ICD-10-CM | POA: Insufficient documentation

## 2016-01-15 DIAGNOSIS — I159 Secondary hypertension, unspecified: Secondary | ICD-10-CM | POA: Diagnosis not present

## 2016-01-15 DIAGNOSIS — Z7982 Long term (current) use of aspirin: Secondary | ICD-10-CM | POA: Diagnosis not present

## 2016-01-15 DIAGNOSIS — R531 Weakness: Secondary | ICD-10-CM | POA: Diagnosis present

## 2016-01-15 DIAGNOSIS — Z79899 Other long term (current) drug therapy: Secondary | ICD-10-CM | POA: Diagnosis not present

## 2016-01-15 DIAGNOSIS — F17200 Nicotine dependence, unspecified, uncomplicated: Secondary | ICD-10-CM | POA: Diagnosis not present

## 2016-01-15 LAB — BRAIN NATRIURETIC PEPTIDE: B Natriuretic Peptide: 434 pg/mL — ABNORMAL HIGH (ref 0.0–100.0)

## 2016-01-15 LAB — CBC WITH DIFFERENTIAL/PLATELET
Basophils Absolute: 0 10*3/uL (ref 0.0–0.1)
Basophils Relative: 1 %
Eosinophils Absolute: 0.1 10*3/uL (ref 0.0–0.7)
Eosinophils Relative: 3 %
HEMATOCRIT: 41.3 % (ref 39.0–52.0)
Hemoglobin: 13.7 g/dL (ref 13.0–17.0)
LYMPHS ABS: 0.8 10*3/uL (ref 0.7–4.0)
Lymphocytes Relative: 19 %
MCH: 28.2 pg (ref 26.0–34.0)
MCHC: 33.2 g/dL (ref 30.0–36.0)
MCV: 85.2 fL (ref 78.0–100.0)
MONOS PCT: 8 %
Monocytes Absolute: 0.3 10*3/uL (ref 0.1–1.0)
NEUTROS ABS: 3 10*3/uL (ref 1.7–7.7)
Neutrophils Relative %: 69 %
Platelets: 186 10*3/uL (ref 150–400)
RBC: 4.85 MIL/uL (ref 4.22–5.81)
RDW: 15.2 % (ref 11.5–15.5)
WBC: 4.3 10*3/uL (ref 4.0–10.5)

## 2016-01-15 LAB — COMPREHENSIVE METABOLIC PANEL
ALK PHOS: 111 U/L (ref 38–126)
ALT: 12 U/L — AB (ref 17–63)
ANION GAP: 5 (ref 5–15)
AST: 39 U/L (ref 15–41)
Albumin: 2.3 g/dL — ABNORMAL LOW (ref 3.5–5.0)
BILIRUBIN TOTAL: 1 mg/dL (ref 0.3–1.2)
BUN: 29 mg/dL — AB (ref 6–20)
CALCIUM: 8 mg/dL — AB (ref 8.9–10.3)
CO2: 24 mmol/L (ref 22–32)
CREATININE: 1.07 mg/dL (ref 0.61–1.24)
Chloride: 107 mmol/L (ref 101–111)
GFR calc Af Amer: >60 mL/min (ref >60)
GFR calc non Af Amer: >60 mL/min (ref >60)
Glucose, Bld: 140 mg/dL — ABNORMAL HIGH (ref 65–99)
Potassium: 4.4 mmol/L (ref 3.5–5.1)
Sodium: 136 mmol/L (ref 135–145)
Total Protein: 6.1 g/dL — ABNORMAL LOW (ref 6.5–8.1)

## 2016-01-15 LAB — TROPONIN I: Troponin I: 0.03 ng/mL (ref <0.031)

## 2016-01-15 MED ORDER — HYDRALAZINE HCL 20 MG/ML IJ SOLN
10.0000 mg | Freq: Once | INTRAMUSCULAR | Status: AC
  Administered 2016-01-15: 10 mg via INTRAVENOUS
  Filled 2016-01-15: qty 1

## 2016-01-15 MED ORDER — LORAZEPAM 0.5 MG PO TABS
0.5000 mg | ORAL_TABLET | Freq: Once | ORAL | Status: AC
  Administered 2016-01-15: 0.5 mg via ORAL
  Filled 2016-01-15: qty 1

## 2016-01-15 NOTE — ED Notes (Signed)
MD aware of diastolic BP and cleared pt for hydralazine to be given.

## 2016-01-15 NOTE — ED Notes (Signed)
Pt brought [facility] from [facility] [place]. Pt reports he has been having generalized weakness over the last 2 days. [facility] staff reports pt has been having left sided weakness, hand tremors, lethargy x 2 days, elevated BP over the last week with some new medication changes to help bring it down. Pt was seen here last week at [facility] for weakness and blood work and CT scan normal per [facility]. Pt's lt hand is red and swollen and pt reports he has gout in that hand currently.

## 2016-01-15 NOTE — ED Provider Notes (Signed)
CSN: [id number]     Arrival date & time 01/15/16  [id number] History  By signing my name below, I, Jasmyn B. Alexander, attest that this documentation has been prepared under the direction and in the presence of Marily MemosJason Giovanne Nickolson, MD.  Electronically Signed: Gillis EndsJasmyn B. Lyn HollingsheadAlexander, ED Scribe. 01/15/2016. 10:05 AM.   Chief Complaint  Patient presents with  . Weakness   The history is provided by the patient. No language interpreter was used.    HPI Comments: Jacob LeavensJohn Riddle is a 75 y.o. male brought in by ambulance, current smoker, with PMHx of HTN and DVT who presents to the Emergency Department complaining of gradual onset, constant, worsening generalized weakness x 1.5 weeks. Pt has associated vision issues, lethargy, mild SOB, and bilateral hand pain due to gout. Pt reports having leg edema, which is now exacerbated with pressure causing pain that just started upon weakness episode. Pt was seen in [facility] on 01/06/16 with CC of HTN. At that time, pt received a prescription for increased dosage of Metoprolol. He does not have any pain elsewhere. Denies any fever, cough, abdominal pain, headaches, back pain.  Past Medical History  Diagnosis Date  . Arthritis   . Gout   . Hypertension   . Fracture     left arm  . Alcohol abuse   . Cholelithiasis 02/24/2014  . Acute blood loss anemia 7//2015    Hematuria  . Lower leg DVT (deep venous thromboembolism), chronic (HCC) 7//2015  . Hematuria, gross 02/24/2014  . Gout flare 02/24/2014  . Urinary retention 02/25/2014  . Anemia   . Edema   . Weakness   . Hypokalemia   . Renal disorder   . BPH (benign prostatic hyperplasia)    Past Surgical History  Procedure Laterality Date  . Prostate biopsy    . Knee surgery Left    No family history on file. Social History  Substance Use Topics  . Smoking status: Current Every Day Smoker -- 1.00 packs/day  . Smokeless tobacco: None  . Alcohol Use: No     Comment: none since 02/12/2014    Review of Systems   Constitutional: Positive for fatigue. Negative for fever.  Eyes: Positive for visual disturbance.  Respiratory: Positive for shortness of breath. Negative for cough.   Cardiovascular: Positive for leg swelling.  Gastrointestinal: Negative for abdominal pain.  Musculoskeletal: Negative for back pain.  Neurological: Positive for weakness. Negative for headaches.  All other systems reviewed and are negative.   Allergies  Lisinopril and Other  Home Medications   Prior to Admission medications   Medication Sig Start Date End Date Taking? Authorizing Provider  Amino Acids-Protein Hydrolys (FEEDING SUPPLEMENT, PRO-STAT SUGAR FREE 64,) LIQD Take 30 mLs by mouth 2 (two) times daily.   Yes Historical Provider, MD  aspirin EC 81 MG tablet Take 81 mg by mouth daily.   Yes Historical Provider, MD  cloNIDine (CATAPRES - DOSED IN MG/24 HR) 0.1 mg/24hr patch Place 0.1 mg onto the skin every Wednesday.   Yes Historical Provider, MD  cloNIDine (CATAPRES) 0.1 MG tablet Take 0.1 mg by mouth 3 (three) times daily.   Yes Historical Provider, MD  ferrous sulfate 325 (65 FE) MG tablet Take 325 mg by mouth 2 (two) times daily with a meal.   Yes Historical Provider, MD  furosemide (LASIX) 20 MG tablet Take 20 mg by mouth daily.   Yes Historical Provider, MD  gabapentin (NEURONTIN) 600 MG tablet Take 600 mg by mouth 3 (three) times daily.  Yes Historical Provider, MD  metoprolol succinate (TOPROL-XL) 50 MG 24 hr tablet Take 1 tablet (50 mg total) by mouth daily. 01/06/16  Yes Bethann Berkshire, MD  morphine (MS CONTIN) 30 MG 12 hr tablet Take 30 mg by mouth 3 (three) times daily.    Yes Historical Provider, MD  Multiple Vitamin (MULTIVITAMIN) capsule Take 1 capsule by mouth daily. 02/16/14  Yes Costin Otelia Sergeant, MD  nitroGLYCERIN (NITROSTAT) 0.4 MG SL tablet Place 0.4 mg under the tongue every 5 (five) minutes as needed for chest pain.   Yes Historical Provider, MD  omeprazole (PRILOSEC) 20 MG capsule Take 20 mg by  mouth daily.   Yes Historical Provider, MD  Skin Protectants, Misc. (EUCERIN) cream Apply 1 application topically at bedtime.   Yes Historical Provider, MD  tamsulosin (FLOMAX) 0.4 MG CAPS capsule Take 0.4 mg by mouth daily.   Yes Historical Provider, MD  thiamine 100 MG tablet Take 1 tablet (100 mg total) by mouth daily. 02/25/14  Yes Elliot Cousin, MD  traZODone (DESYREL) 50 MG tablet Take 50 mg by mouth at bedtime.   Yes Historical Provider, MD  vitamin C (ASCORBIC ACID) 500 MG tablet Take 500 mg by mouth daily.   Yes Historical Provider, MD  acetaminophen (TYLENOL) 325 MG tablet Take 2 tablets (650 mg total) by mouth every 8 (eight) hours as needed for mild pain. 02/16/14   Costin Otelia Sergeant, MD  colchicine 0.6 MG tablet Take 0.6 mg by mouth every 6 (six) hours as needed (gout).    Historical Provider, MD  collagenase (SANTYL) ointment Apply 1 application topically daily. Reported on 01/15/2016    Historical Provider, MD  guaifenesin (HUMIBID E) 400 MG TABS tablet Take 400 mg by mouth every 4 (four) hours as needed (cold symptoms).    Historical Provider, MD  magnesium hydroxide (MILK OF MAGNESIA) 400 MG/5ML suspension Take 15 mLs by mouth daily. Patient taking differently: Take 15 mLs by mouth daily as needed for mild constipation.  02/25/14   Elliot Cousin, MD  Melatonin 1 MG TABS Take 1 tablet by mouth at bedtime as needed (insomnia). Reported on 01/06/2016    Historical Provider, MD   BP 190/58 mmHg  Pulse 62  Temp(Src) 98 F (36.7 C) (Oral)  Resp 17  Ht 6' (1.829 m)  Wt 180 lb (81.647 kg)  BMI 24.41 kg/m2  SpO2 99% Physical Exam  Constitutional: He is oriented to person, place, and time. He appears well-developed and well-nourished. No distress.  Hypertensive  HENT:  Head: Normocephalic and atraumatic.  Eyes: Conjunctivae are normal.  Cardiovascular: Regular rhythm.   Bradycardic in 50's. 2+ distal pulses.  Pulmonary/Chest: Effort normal. He has rales.  Crackles in lungs. Bilateral  rales.   Abdominal: Soft. He exhibits no distension. There is no tenderness.  Musculoskeletal: He exhibits edema.  Bilateral pitting edema distal to knee.  Neurological: He is alert and oriented to person, place, and time.  Skin: Skin is warm and dry.  Psychiatric: He has a normal mood and affect.  Nursing note and vitals reviewed.   ED Course  Procedures (including critical care time) DIAGNOSTIC STUDIES: Oxygen Saturation is 97% on RA, normal by my interpretation.    COORDINATION OF CARE: 9:56 AM-Discussed treatment plan which includes EKG, labs, head CT with pt at bedside and pt agreed to plan.    Labs Review Labs Reviewed  COMPREHENSIVE METABOLIC PANEL - Abnormal; Notable for the following:    Glucose, Bld 140 (*)    BUN  29 (*)    Calcium 8.0 (*)    Total Protein 6.1 (*)    Albumin 2.3 (*)    ALT 12 (*)    All other components within normal limits  BRAIN NATRIURETIC PEPTIDE - Abnormal; Notable for the following:    B Natriuretic Peptide 434.0 (*)    All other components within normal limits  CBC WITH DIFFERENTIAL/PLATELET  TROPONIN I    Imaging Review Dg Chest 2 View  01/15/2016  CLINICAL DATA:  Weakness, smoker, evaluate for pulmonary edema EXAM: CHEST  2 VIEW COMPARISON:  Seven/16/15 FINDINGS: Cardiomediastinal silhouette is stable. There is small left pleural effusion with left basilar atelectasis or infiltrate. Right lung is clear. Again noted old fracture of the left seventh rib. No pulmonary edema. IMPRESSION: Small left pleural effusion with left basilar atelectasis or infiltrate. No pulmonary edema. Electronically Signed   By: Natasha Mead M.D.   On: 01/15/2016 11:22   Ct Head Wo Contrast  01/15/2016  CLINICAL DATA:  Generalized weakness. Left-sided weakness and tremors. EXAM: CT HEAD WITHOUT CONTRAST TECHNIQUE: Contiguous axial images were obtained from the base of the skull through the vertex without intravenous contrast. COMPARISON:  February 12, 2014 CT of the brain  FINDINGS: Normal lung bases. Extracranial soft tissues are unremarkable other than suggested mild thickening of the skin over the right forehead, of doubtful significance. No subdural, epidural, or subarachnoid hemorrhage. The ventricles and sulci are prominent but stable. 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 Calcifications in the basal ganglia are stable. No acute cortical ischemia or infarct is identified. No mass, mass effect, or midline shift. The cerebellum, brainstem, and basal cisterns are normal. IMPRESSION: No acute intracranial process. Electronically Signed   By: Gerome Sam III M.D   On: 01/15/2016 11:36   I have personally reviewed and evaluated these images and lab results as part of my medical decision-making.   EKG Interpretation   Date/Time:  Sunday January 15 2016 09:40:26 EDT Ventricular Rate:  54 PR Interval:  158 QRS Duration: 139 QT Interval:  484 QTC Calculation: 459 R Axis:   87 Text Interpretation:  Sinus rhythm Left atrial enlargement Right bundle  branch block Minimal ST elevation, anterior leads Confirmed by [facility] MD,  Barbara Cower [phone]) on 01/15/2016 9:43:58 AM      MDM   Final diagnoses:  Weakness  Secondary hypertension, unspecified    74 yo M w/ weakness and HTN. No e/o infection, significant fluid overload, ischemia, metabolic or neurologic causes for his symptoms. Able to move/ambulate at baseline. Appears well overall. Can follow up with PCP for further workup of his weakness.   New Prescriptions: Discharge Medication List as of 01/15/2016  2:24 PM       I have personally and contemperaneously reviewed labs and imaging and used in my decision making as above.   A medical screening exam was performed and I feel the patient has had an appropriate workup for their chief complaint at this time and likelihood of emergent condition existing is low and thus workup can continue on an outpatient basis.. Their vital signs are  stable. They have been counseled on decision, discharge, follow up and which symptoms necessitate immediate return to the emergency department.  They verbally stated understanding and agreement with plan and discharged in stable condition.   I personally performed the services described in this documentation, which was scribed in my presence. The recorded information has been reviewed and is accurate.    Barbara Cower  Mandee Pluta, MD 01/15/16 1605

## 2016-11-04 DEATH — deceased
